# Patient Record
Sex: Male | Born: 1975
Health system: Southern US, Community
[De-identification: ages and names within clinical notes are randomized; demographics above are authoritative.]

## PROBLEM LIST (undated history)

## (undated) DIAGNOSIS — I10 Essential (primary) hypertension: Secondary | ICD-10-CM

## (undated) HISTORY — DX: Essential (primary) hypertension: I10

---

## 1997-11-26 ENCOUNTER — Emergency Department (HOSPITAL_COMMUNITY): Admission: EM | Admit: 1997-11-26 | Discharge: 1997-11-26 | Payer: Self-pay | Admitting: Emergency Medicine

## 2004-04-01 ENCOUNTER — Ambulatory Visit: Payer: Self-pay | Admitting: Family Medicine

## 2008-07-04 ENCOUNTER — Observation Stay (HOSPITAL_COMMUNITY): Admission: EM | Admit: 2008-07-04 | Discharge: 2008-07-06 | Payer: Self-pay | Admitting: Emergency Medicine

## 2008-07-04 ENCOUNTER — Ambulatory Visit: Payer: Self-pay | Admitting: Cardiology

## 2008-07-06 ENCOUNTER — Encounter (INDEPENDENT_AMBULATORY_CARE_PROVIDER_SITE_OTHER): Payer: Self-pay | Admitting: *Deleted

## 2008-08-06 ENCOUNTER — Emergency Department (HOSPITAL_COMMUNITY): Admission: EM | Admit: 2008-08-06 | Discharge: 2008-08-06 | Payer: Self-pay | Admitting: Emergency Medicine

## 2010-06-07 LAB — BASIC METABOLIC PANEL
CO2: 25 mEq/L (ref 19–32)
Calcium: 9.5 mg/dL (ref 8.4–10.5)
Chloride: 106 mEq/L (ref 96–112)
GFR calc Af Amer: >60 mL/min (ref >60)
Glucose, Bld: 94 mg/dL (ref 70–99)
Potassium: 4.1 mEq/L (ref 3.5–5.1)
Sodium: 139 mEq/L (ref 135–145)

## 2010-06-07 LAB — TROPONIN I
Troponin I: 0.01 ng/mL (ref 0.00–0.06)
Troponin I: 0.01 ng/mL (ref 0.00–0.06)

## 2010-06-07 LAB — CBC
HCT: 40.3 % (ref 39.0–52.0)
Hemoglobin: 13.6 g/dL (ref 13.0–17.0)
MCHC: 33.8 g/dL (ref 30.0–36.0)
MCV: 76.7 fL — ABNORMAL LOW (ref 78.0–100.0)
RBC: 5.26 MIL/uL (ref 4.22–5.81)
RDW: 14.3 % (ref 11.5–15.5)

## 2010-06-07 LAB — TSH: TSH: 0.558 u[IU]/mL (ref 0.350–4.500)

## 2010-06-07 LAB — CK TOTAL AND CKMB (NOT AT ARMC)
CK, MB: 1.4 ng/mL (ref 0.3–4.0)
CK, MB: 1.6 ng/mL (ref 0.3–4.0)
CK, MB: 2 ng/mL (ref 0.3–4.0)
Relative Index: 1.1 (ref 0.0–2.5)
Relative Index: 1.1 (ref 0.0–2.5)
Total CK: 197 U/L (ref 7–232)

## 2010-06-07 LAB — CARDIAC PANEL(CRET KIN+CKTOT+MB+TROPI)
CK, MB: 1.1 ng/mL (ref 0.3–4.0)
Total CK: 89 U/L (ref 7–232)
Troponin I: 0.01 ng/mL (ref 0.00–0.06)

## 2010-06-07 LAB — LIPID PANEL
LDL Cholesterol: 108 mg/dL — ABNORMAL HIGH (ref 0–99)
Total CHOL/HDL Ratio: 5.2 RATIO
Triglycerides: 87 mg/dL (ref <150)
VLDL: 17 mg/dL (ref 0–40)

## 2010-06-07 LAB — POCT CARDIAC MARKERS
CKMB, poc: 1.3 ng/mL (ref 1.0–8.0)
Troponin i, poc: <0.05 ng/mL (ref 0.00–0.09)

## 2010-07-12 NOTE — Consult Note (Signed)
NAMEERICE, AHLES NO.:  [id number]   MEDICAL RECORD NO.:  [id number]          PATIENT TYPE:  INP   LOCATION:  1823                         FACILITY:  [facility]   PHYSICIAN:  Luis Abed, MD, FACCDATE OF BIRTH:  03/23/1975   DATE OF CONSULTATION:  DATE OF DISCHARGE:                                 CONSULTATION   PRIMARY CARE PHYSICIAN:  None.   PRIMARY CARDIOLOGIST:  New will be Luis Abed, MD, [facility]   CHIEF COMPLAINT:  Chest pain.   HISTORY OF PRESENT ILLNESS:  Mr. Maselli is a 35 year old male with no  previous history of coronary artery disease.  He was wakened by chest  pain at 3 a.m. 4 days ago.  He states the pain was very severe; when it  first awakened him, it was a 10/10.  It waxed and waned, but never  resolved.  Today, he felt that it was getting a little worse and came to  the emergency room.  He has received three nitroglycerin with possible  help as well as aspirin.  Currently, his pain is improved, but not  completely resolved.  He has never had this kind of pain before.  The  pain is made worse by lying supine and he states he also feels that it  is worse whenever he uses the muscles of his chest as in reaching  forward or lifting something.  He does not feel that it is obviously  worse with deep inspiration.   PAST MEDICAL HISTORY:  He has not had a recent checkup and does not know  of any medical problems.  His cardiac risk factor is family history of  premature coronary artery disease.   PAST SURGICAL HISTORY:  None.   ALLERGIES:  No known drug allergies.   MEDICATIONS PRIOR TO ADMISSION:  None.   SOCIAL HISTORY:  He is single and lives in [place].  He is currently  employed.   FAMILY HISTORY:  His father is deceased and had an MI and a history of  bypass surgery.  There is no coronary artery disease known in any other  first-degree relatives.   REVIEW OF SYSTEMS:  He has not had fevers, chills, or sweats.  The chest  pain  as described above.  There are no known alleviating factors of the  chest pain and no associated symptoms except slight shortness of breath.  He has occasional arthralgias, but these are generally secondary to  overuse of muscle.  He does not have any depression or anxiety.  He does  not have reflux symptoms and no hematemesis, hemoptysis, or melena.  Full 14-point review of systems is otherwise negative.   PHYSICAL EXAMINATION:  VITAL SIGNS:  Temperature is 98.1, blood pressure  132/86, pulse 83, respiratory rate 20, and O2 saturation 100% on room  air.  GENERAL:  He is a well-developed [place] American male in no acute  distress.  HEENT:  Normal.  NECK:  There is no lymphadenopathy, thyromegaly, bruit, or JVD noted.  CV:  His heart is regular in rate and rhythm with an S1 and  S2.  No  significant murmur, rub, or gallop is noted.  LUNGS:  Essentially clear to auscultation bilaterally.  SKIN:  No rashes or lesions are noted.  ABDOMEN:  Soft and nontender with active bowel sounds.  EXTREMITIES:  There is no cyanosis, clubbing, or edema noted.  Distal  pulses are intact in all four extremities.  MUSCULOSKELETAL:  There is no joint deformity or effusions and no spine  or CVA tenderness.  There is some mild chest tenderness.  NEUROLOGIC:  He is alert and oriented with cranial nerves II-XII grossly  intact.   Chest x-ray:  No acute disease.   CT angiogram of the chest:  No evidence of pulmonary embolism, no acute  abnormality.  Multiple borderline axillary lymph nodes, which may be  hyperplastic, but a lymphoproliferative disorder cannot be excluded.   EKG:  Sinus rhythm, rate 85 with no acute ischemic changes.   Laboratory values are pending but point-of-care markers negative x1.   IMPRESSION:  Mr. Dowty was seen today by Dr. Myrtis Ser.  He has chest pain  with atypical features and no exertional component.  It is possibly  related to pericarditis.  We will try Toradol for pain control  and cycle  enzymes as ordered.  There is no rub and only mild chest wall tenderness  by exam.  He does have slight J-point elevation on his EKG.  Further  evaluation and treatment will depend on the results of the above testing  and intervention.      Theodore Demark, PA-C      Luis Abed, MD, [facility]  Electronically Signed    RB/MEDQ  D:  07/04/2008  T:  07/05/2008  Job:  [id number]

## 2010-07-12 NOTE — H&P (Signed)
Ian Reid, Ian Reid NO.:  [id number]   MEDICAL RECORD NO.:  [id number]          PATIENT TYPE:  INP   LOCATION:  3705                         FACILITY:  [facility]   PHYSICIAN:  Monte Fantasia, MD  DATE OF BIRTH:  05/24/1945   DATE OF ADMISSION:  07/04/2008  DATE OF DISCHARGE:                              HISTORY & PHYSICAL   PRIMARY CARE PHYSICIAN:  Unassigned.   CHIEF COMPLAINT:  Chest tightness since last 2-3 days.   HISTORY OF PRESENT ILLNESS:  A 35 year old African American gentleman  patient has come in with complaints of substernal chest pain which woke  him up at 3:00 a.m. in the morning associated with diaphoresis, which  occurred 2 days back.  The patient states that this chest pain has been  kind of persistent that will past 2-3 days, has an exertional component  to it.  The patient does complain of exertional shortness of breath on  climbing up 20 flights of stairs which is something new since last 2-3  days.  The patient denies at present of any nausea, vomiting, dizziness,  loss of consciousness, syncopal episode, or weakness.  The patient  denies any complaints of fever, cough, or productive phlegm.  The  patient denies of the pain being increasing or decreasing or associated  with his respirations.  Also, the patient denies of any abdominal pain.  No complaints of diarrhea.  No complaints of constipation.  No  complaints of any bowel or bladder complaints.   REVIEW OF SYSTEMS:  Review of 12-point systems have been negative other  than those mentioned in the HPI.   ALLERGIES:  The patient is allergic to ASPIRIN which he gets hives with  it.   PAST MEDICAL HISTORY:  None.   SOCIAL HISTORY:  The patient occasionally drinks over the weekend 1 or 2  beers.  No history of IV drug use.  No history of smoking.   FAMILY HISTORY:  Significant for brother having cardiac disease who is  age less than 34 and father having undergone a bypass at the age  of 62.   MEDICATIONS:  None.   PHYSICAL EXAMINATION:  VITAL SIGNS: On admission, temperature of 98.1,  blood pressure 139/81, heart rate of 82, respirations 20.  GENERAL:  The patient is comfortable in bed, in no apparent respiratory  distress.  HEENT:  Pupils equal, reacting to light.  No pallor.  No  lymphadenopathy.  NECK:  Supple.  No JVDs.  No carotid bruits.  RESPIRATORY:  Air entry is bilaterally equal.  No rales, no rhonchi.  CARDIOVASCULAR:  S1 and S2, regular rate and rhythm.  No murmurs heard.  Pain not reproducible on chest pressure.  ABDOMEN:  Soft.  No guarding, no rigidity, no tenderness.  EXTREMITIES:  No edema of feet.  Pedal pulses well felt.  SKIN:  Intact.  No evidence of any skin breakdown or rashes.  CNS:  The patient is alert, awake and oriented x3.  No focal  neurological deficits.   RADIOLOGICAL INVESTIGATIONS DONE AT THE TIME OF ADMISSION:  CT angio  done at on  Jul 04, 2008, impression, negative CT.  No evidence of any  pulmonary emboli or acute abnormality, multiple borderline axillary  lymph nodes, may be hyperplastic but lymphoproliferative disorder cannot  be excluded.   LABORATORIES DONE AT THE TIME OF ADMISSION:  Total WBCs 6.1, hemoglobin  13.6, hematocrit 40.3, platelets of 209, D-dimer 1.76.  Sodium 139,  potassium 4.1, chloride 106, bicarb 25, glucose 94, BUN 13, creatinine  0.92, calcium of 9.5.  Cardiac enzymes, one set has been negative.   ASSESSMENT AND PLAN:  Left-sided precordial chest pain, exertional in  nature to rule out any cardiac causes.   Plan is to admit the patient to a telemetry bed.  We will monitor the  patient for next 24 hours.  Have cardiac enzymes x3 sets done.  The  patient does complain of exertional chest pain given his age and family  history.  The patient will need stress test.  We would also get a 2-D  echo on him.  We would have a cardiology evaluation for the patient.  We  would also give him Plavix, as the  patient is allergic to aspirin.  DVT  and GI prophylaxis on Lovenox and Protonix.   Total time for admission of 45 minutes.      Monte Fantasia, MD  Electronically Signed     MP/MEDQ  D:  07/04/2008  T:  07/05/2008  Job:  [id number]

## 2010-07-12 NOTE — Discharge Summary (Signed)
Ian Reid, Ian Reid NO.:  [id number]   MEDICAL RECORD NO.:  [id number]          PATIENT TYPE:  INP   LOCATION:  2038                         FACILITY:  [facility]   PHYSICIAN:  Altha Harm, MDDATE OF BIRTH:  [birth year]   DATE OF ADMISSION:  07/04/2008  DATE OF DISCHARGE:  07/06/2008                               DISCHARGE SUMMARY   DISCHARGE DISPOSITION:  Home.   FINAL DISCHARGE DIAGNOSES:  1. Acute pericarditis.  2. Hypertension.  3. Nonspecific axillary lymphadenopathy.   CONSULTANTS:  Luis Abed, MD, [facility]   PROCEDURES:  None.   DIAGNOSTIC STUDIES:  1. Chest x-ray 1-view done on admission which shows no acute      cardiopulmonary process.  2. CT pulmonary angiogram done on admission which shows negative CT      angiography of the chest and no evidence of pulmonary embolus.      There are multiple borderline axillary lymph nodes.  3. A 2D echocardiogram done on Jul 06, 2008 which shows normal left      ventricular size and thickness.  Preserved systolic function with      an ejection fraction ranging from 55-60%.  No regional wall motion      abnormalities.  There is a calcified annulus of the mitral valve.   CODE STATUS:  Full code.   ALLERGIES:  ASPIRIN.   PRIMARY CARE PHYSICIAN:  Unassigned.   CHIEF COMPLAINT:  Chest pain.   HISTORY AND PHYSICAL:  Please refer to the H and P for details of the  HPI.   HOSPITAL COURSE:  1. The patient on admission was complaining of chest pain.  An EKG      revealed findings of slightly J point elevation which could be      consistent with pericarditis.  The patient continued to have  chest      discimfort and pain which actually appeared to be musculoskeletal      in nature.  He was started on colchicine and ibuprofen which      improved his pain.  A 2D echocardiogram was performed with findings      as noted above.  Recommendations per cardiology was that there was      no necessity for further  workup at this time.  Plans for treatment:      Colchicine for 4 weeks and ibuprofen for 4 weeks.  The patient does      not have a primary care physician and has been advised to follow up      with a primary care physician of his choosing as an outpatient.  2. Hypertension.  The patient was found to be stage I hypertension,      started on hydrochlorothiazide in the hospital.  He will need to      follow up with a primary care physician on the outside to evaluate      effective treatment and for further titration of medications as      necessary.  3. Axillary lymphadenopathy.  Surreptitious findings on the CT showed      some  borderline axillary lymphadenopathy.  The lymphadenopathy is      not palpable from the surface.  I advised the patient that he needs      to follow up with a primary care physician and have serial x-rays      likely within 1-3 months for further evaluation of the lymph nodes.      The patient did not show proliferation of lymph nodes on his      laboratory studies here in the hospital.  He had a normal white      blood cell count on admission with total white blood cell count      being 6.1.  The patient may actually benefit from having serial      WBCs done as an outpatient with a differential to evaluate for any      lymphocytosis.   Total time for discharge 22 minutes.      Altha Harm, MD  Electronically Signed     MAM/MEDQ  D:  07/06/2008  T:  07/06/2008  Job:  [id number]   cc:   Luis Abed, MD, [facility]

## 2012-07-04 ENCOUNTER — Other Ambulatory Visit: Payer: Self-pay | Admitting: Occupational Medicine

## 2012-07-04 ENCOUNTER — Ambulatory Visit: Payer: Self-pay

## 2012-07-04 DIAGNOSIS — Z021 Encounter for pre-employment examination: Secondary | ICD-10-CM

## 2012-10-03 ENCOUNTER — Other Ambulatory Visit: Payer: Self-pay | Admitting: Occupational Medicine

## 2012-10-03 ENCOUNTER — Ambulatory Visit: Payer: Self-pay

## 2012-10-03 DIAGNOSIS — Z0000 Encounter for general adult medical examination without abnormal findings: Secondary | ICD-10-CM

## 2013-01-07 ENCOUNTER — Ambulatory Visit (INDEPENDENT_AMBULATORY_CARE_PROVIDER_SITE_OTHER): Payer: BC Managed Care – PPO | Admitting: Family Medicine

## 2013-01-07 ENCOUNTER — Ambulatory Visit: Payer: BC Managed Care – PPO

## 2013-01-07 VITALS — BP 162/94 | HR 73 | Temp 98.3°F | Resp 18 | Ht 68.5 in | Wt 214.0 lb

## 2013-01-07 DIAGNOSIS — R0602 Shortness of breath: Secondary | ICD-10-CM

## 2013-01-07 DIAGNOSIS — R0789 Other chest pain: Secondary | ICD-10-CM

## 2013-01-07 DIAGNOSIS — R9431 Abnormal electrocardiogram [ECG] [EKG]: Secondary | ICD-10-CM

## 2013-01-07 DIAGNOSIS — J209 Acute bronchitis, unspecified: Secondary | ICD-10-CM

## 2013-01-07 DIAGNOSIS — I10 Essential (primary) hypertension: Secondary | ICD-10-CM

## 2013-01-07 MED ORDER — AZITHROMYCIN 250 MG PO TABS: ORAL_TABLET | ORAL | Status: DC

## 2013-01-07 MED ORDER — HYDROCHLOROTHIAZIDE 25 MG PO TABS: 25.0000 mg | ORAL_TABLET | Freq: Every day | ORAL | Status: DC

## 2013-01-07 MED ORDER — HYDROCODONE-HOMATROPINE 5-1.5 MG/5ML PO SYRP: 5.0000 mL | ORAL_SOLUTION | Freq: Three times a day (TID) | ORAL | Status: DC | PRN

## 2013-01-07 MED ORDER — IPRATROPIUM BROMIDE 0.03 % NA SOLN: 2.0000 | Freq: Four times a day (QID) | NASAL | Status: DC

## 2013-01-07 NOTE — Patient Instructions (Addendum)
Stay away from all otc cough/cold products with pseudoephedrine and phenylephrine in them - any decongestant product.  There is a line of products call "Coracidin" - they have a heart on the bottle - made for people with high blood pressure.  You are welcome to come back to see me this Sunday - anytime after 10 a.m. - we close at 6 p.m. Monday 8:30 - 2, or Wednesday 5 - 8 p.m.  Remember to be fasting at this visit so we can get all of your blood work.   If you don't want to wait in the walk-in clinic, you can schedule an appointment as well by calling [phone] - these appts take place in our building next door at [address]. I see patients there on Fridays.  DASH Diet The DASH diet stands for "Dietary Approaches to Stop Hypertension." It is a healthy eating plan that has been shown to reduce high blood pressure (hypertension) in as little as 14 days, while also possibly providing other significant health benefits. These other health benefits include reducing the risk of breast cancer after menopause and reducing the risk of type 2 diabetes, heart disease, colon cancer, and stroke. Health benefits also include weight loss and slowing kidney failure in patients with chronic kidney disease.  DIET GUIDELINES  Limit salt (sodium). Your diet should contain less than 1500 mg of sodium daily.  Limit refined or processed carbohydrates. Your diet should include mostly whole grains. Desserts and added sugars should be used sparingly.  Include small amounts of heart-healthy fats. These types of fats include nuts, oils, and tub margarine. Limit saturated and trans fats. These fats have been shown to be harmful in the body. CHOOSING FOODS  The following food groups are based on a 2000 calorie diet. See your Registered Dietitian for individual calorie needs. Grains and Grain Products (6 to 8 servings daily)  Eat More Often: Whole-wheat bread, brown rice, whole-grain or wheat pasta, quinoa, popcorn without added fat or  salt (air popped).  Eat Less Often: White bread, white pasta, white rice, cornbread. Vegetables (4 to 5 servings daily)  Eat More Often: Fresh, frozen, and canned vegetables. Vegetables may be raw, steamed, roasted, or grilled with a minimal amount of fat.  Eat Less Often/Avoid: Creamed or fried vegetables. Vegetables in a cheese sauce. Fruit (4 to 5 servings daily)  Eat More Often: All fresh, canned (in natural juice), or frozen fruits. Dried fruits without added sugar. One hundred percent fruit juice ( cup [237 mL] daily).  Eat Less Often: Dried fruits with added sugar. Canned fruit in light or heavy syrup. [employer], Fish, and Poultry (2 servings or less daily. One serving is 3 to 4 oz [85-114 g]).  Eat More Often: Ninety percent or leaner ground beef, tenderloin, sirloin. Round cuts of beef, chicken breast, [place] breast. All fish. Grill, bake, or broil your meat. Nothing should be fried.  Eat Less Often/Avoid: Fatty cuts of meat, [place], or chicken leg, thigh, or wing. Fried cuts of meat or fish. Dairy (2 to 3 servings)  Eat More Often: Low-fat or fat-free milk, low-fat plain or light yogurt, reduced-fat or part-skim cheese.  Eat Less Often/Avoid: Milk (whole, 2%).Whole milk yogurt. Full-fat cheeses. Nuts, Seeds, and Legumes (4 to 5 servings per week)  Eat More Often: All without added salt.  Eat Less Often/Avoid: Salted nuts and seeds, canned beans with added salt. Fats and Sweets (limited)  Eat More Often: Vegetable oils, tub margarines without trans fats, sugar-free gelatin. Mayonnaise  and salad dressings.  Eat Less Often/Avoid: Coconut oils, palm oils, butter, stick margarine, cream, half and half, cookies, candy, pie. FOR MORE INFORMATION The Dash Diet Eating Plan: www.dashdiet.org Document Released: 02/02/2011 Document Revised: 05/08/2011 Document Reviewed: 02/02/2011 [facility] Patient Information 2014 [place], [place]. Hypertension As your heart beats, it forces  blood through your arteries. This force is your blood pressure. If the pressure is too high, it is called hypertension (HTN) or high blood pressure. HTN is dangerous because you may have it and not know it. High blood pressure may mean that your heart has to work harder to pump blood. Your arteries may be narrow or stiff. The extra work puts you at risk for heart disease, stroke, and other problems.  Blood pressure consists of two numbers, a higher number over a lower, 110/72, for example. It is stated as "110 over 72." The ideal is below 120 for the top number (systolic) and under 80 for the bottom (diastolic). Write down your blood pressure today. You should pay close attention to your blood pressure if you have certain conditions such as:  Heart failure.  Prior heart attack.  Diabetes  Chronic kidney disease.  Prior stroke.  Multiple risk factors for heart disease. To see if you have HTN, your blood pressure should be measured while you are seated with your arm held at the level of the heart. It should be measured at least twice. A one-time elevated blood pressure reading (especially in the Emergency Department) does not mean that you need treatment. There may be conditions in which the blood pressure is different between your right and left arms. It is important to see your caregiver soon for a recheck. Most people have essential hypertension which means that there is not a specific cause. This type of high blood pressure may be lowered by changing lifestyle factors such as:  Stress.  Smoking.  Lack of exercise.  Excessive weight.  Drug/tobacco/alcohol use.  Eating less salt. Most people do not have symptoms from high blood pressure until it has caused damage to the body. Effective treatment can often prevent, delay or reduce that damage. TREATMENT  When a cause has been identified, treatment for high blood pressure is directed at the cause. There are a large number of medications  to treat HTN. These fall into several categories, and your caregiver will help you select the medicines that are best for you. Medications may have side effects. You should review side effects with your caregiver. If your blood pressure stays high after you have made lifestyle changes or started on medicines,   Your medication(s) may need to be changed.  Other problems may need to be addressed.  Be certain you understand your prescriptions, and know how and when to take your medicine.  Be sure to follow up with your caregiver within the time frame advised (usually within two weeks) to have your blood pressure rechecked and to review your medications.  If you are taking more than one medicine to lower your blood pressure, make sure you know how and at what times they should be taken. Taking two medicines at the same time can result in blood pressure that is too low. SEEK IMMEDIATE MEDICAL CARE IF:  You develop a severe headache, blurred or changing vision, or confusion.  You have unusual weakness or numbness, or a faint feeling.  You have severe chest or abdominal pain, vomiting, or breathing problems. MAKE SURE YOU:   Understand these instructions.  Will watch your condition.  Will get help right away if you are not doing well or get worse. Document Released: 02/13/2005 Document Revised: 05/08/2011 Document Reviewed: 10/04/2007 [facility] Patient Information 2014 [place], [place].

## 2013-01-07 NOTE — Progress Notes (Signed)
This chart was scribed for Norberto Sorenson, MD at [facility] by Yevette Edwards, Scribe. This pt's care was started at 3:20 PM.  Subjective:    Patient ID: Ian Reid, male    DOB: 05-24-1975, 37 y.o.   MRN: [id number]  Chief Complaint  Patient presents with  . cough, congestion, fever, not feeling well since sunday  . Hypertension    HPI  Ian Reid is a 37 y.o. male, with a h/o HTN, who presents to [facility] complaining of a persistent cough productive of yellow phlegm which began two days ago. Now accompanied by nasal congestion, a subjective fever, and chills. He reports that his sleep has been disturbed due to the cough. Additional symptoms he has experienced are  "burning" to his chest, worse on the left side, SOB, and constant chest tightness esp w/ deep breathing.  The pt denies experiencing sinus pressure, sore throat, chest pain, or wheezing.   The pt has utilized Nyquil and Dayquil to mitigate the symptoms with minimal relief.   The pt has taken blood pressure medication - tried 2 different types in the past, seeing the occupational health dr at his job, but he stopped taking the medication due to erectile dysfunction.  He reports that he occasionally checks his blood pressure at home as his brother has acuff, and his blood pressure is generally in the 160/100 range. States his brother gets on him about getting to the dr and getting his BP down as his brother has had to have a kidney transplant due to DM and HTN.  The pt is a non-smoker.   He does not have a PCP.   No current outpatient prescriptions on file prior to visit.   No current facility-administered medications on file prior to visit.   Past Medical History  Diagnosis Date  . Hypertension     Allergies  Allergen Reactions  . Asa [Aspirin]    Review of Systems  Constitutional: Positive for fever, chills, diaphoresis, activity change, appetite change and fatigue. Negative for unexpected weight change.   HENT: Positive for congestion, postnasal drip and rhinorrhea. Negative for ear pain, facial swelling, sinus pressure, sore throat and trouble swallowing.   Respiratory: Positive for cough, chest tightness and shortness of breath. Negative for wheezing.   Cardiovascular: Negative for chest pain, palpitations and leg swelling.  Hematological: Negative for adenopathy.  Psychiatric/Behavioral: Positive for sleep disturbance.    Triage Vitals: BP 162/94  Pulse 73  Temp(Src) 98.3 F (36.8 C) (Oral)  Resp 18  Ht 5' 8.5" (1.74 m)  Wt 214 lb (97.07 kg)  BMI 32.06 kg/m2  SpO2 100%    Objective:   Physical Exam  Nursing note and vitals reviewed. Constitutional: He is oriented to person, place, and time. He appears well-developed and well-nourished. No distress.  HENT:  Head: Normocephalic and atraumatic.  Right Ear: Tympanic membrane is erythematous and retracted.  Left Ear: Tympanic membrane is erythematous and retracted.  Nose: Mucosal edema and rhinorrhea present.  Mouth/Throat: Uvula is midline. Posterior oropharyngeal edema present.  Eyes: EOM are normal.  Neck: Neck supple. No tracheal deviation present. No thyromegaly present.  Cardiovascular: Normal rate, regular rhythm and normal heart sounds.   No murmur heard. Pulmonary/Chest: Effort normal and breath sounds normal. No respiratory distress. He has no wheezes.  Musculoskeletal: Normal range of motion.  Lymphadenopathy:       Head (right side): No submental, no submandibular, no tonsillar, no preauricular and no posterior auricular adenopathy present.  Head (left side): No submental, no submandibular, no tonsillar, no preauricular and no posterior auricular adenopathy present.    He has no cervical adenopathy.  Neurological: He is alert and oriented to person, place, and time.  Skin: Skin is warm and dry.  Psychiatric: He has a normal mood and affect. His behavior is normal.   [facility] reading (PRIMARY) by  Dr. Clelia Croft. chest  x-ray normal.    EXAM: CHEST 2 VIEW  COMPARISON: 10/03/2012  FINDINGS: The heart size and mediastinal contours are within normal limits. Both lungs are clear. The visualized skeletal structures are unremarkable.  IMPRESSION: No active cardiopulmonary disease.    Pt's predicted Reid flow is 633, but his measured flow today is 450.   EKG reading: Atrial enlargement. Flipped T-waves in lead 1, v1, v5, v6.  Lead 3 with R, R'. 1 mm of ST elevation in V1, V2, V3 - Suspect early repol varietn.    Assessment & Plan:  Chest tightness - Plan: DG Chest 2 View, EKG 12-Lead  Shortness of breath - Plan: DG Chest 2 View, EKG 12-Lead  Essential hypertension, benign - not tolerant of 2 prior bp meds due to ED side effect. Start hctz 25 and recheck in 1 wk fasting - pt will bring prior bp meds w/ him so we can see what he failed.  Discussed what otc cough/cold medicines to avoid - stop dayquil/nyquil, decongestants, etc. Reviewed low salt DASH diet.  Nonspecific abnormal electrocardiogram (ECG) (EKG) - EKG concerning for cardiac strain - discussed w/ pt importance of BP control and assessing other risk factors at f/u such as fasting lipids. Plan: Ambulatory referral to Cardiology - i think that all of pt's current sxs are clearly tied to his acute illness but warned to RTC or call 911 if CP continues, changes, or worsens.  Acute bronchitis  Meds ordered this encounter  Medications  . azithromycin (ZITHROMAX) 250 MG tablet    Sig: Take 2 tabs PO x 1 dose, then 1 tab PO QD x 4 days    Dispense:  6 tablet    Refill:  0  . HYDROcodone-homatropine (HYCODAN) 5-1.5 MG/5ML syrup    Sig: Take 5 mLs by mouth every 8 (eight) hours as needed for cough.    Dispense:  120 mL    Refill:  0  . ipratropium (ATROVENT) 0.03 % nasal spray    Sig: Place 2 sprays into the nose 4 (four) times daily.    Dispense:  30 mL    Refill:  1  . hydrochlorothiazide (HYDRODIURIL) 25 MG tablet    Sig: Take 1 tablet (25 mg  total) by mouth daily.    Dispense:  30 tablet    Refill:  1    I personally performed the services described in this documentation, which was scribed in my presence. The recorded information has been reviewed and considered, and addended by me as needed.  Norberto Sorenson, MD MPH

## 2013-03-06 ENCOUNTER — Ambulatory Visit (INDEPENDENT_AMBULATORY_CARE_PROVIDER_SITE_OTHER): Payer: BC Managed Care – PPO | Admitting: Internal Medicine

## 2013-03-06 ENCOUNTER — Encounter: Payer: Self-pay | Admitting: Internal Medicine

## 2013-03-06 VITALS — BP 152/110 | HR 97 | Ht 68.0 in | Wt 218.7 lb

## 2013-03-06 DIAGNOSIS — R9431 Abnormal electrocardiogram [ECG] [EKG]: Secondary | ICD-10-CM

## 2013-03-06 DIAGNOSIS — I10 Essential (primary) hypertension: Secondary | ICD-10-CM

## 2013-03-06 DIAGNOSIS — R0602 Shortness of breath: Secondary | ICD-10-CM

## 2013-03-06 MED ORDER — VALSARTAN-HYDROCHLOROTHIAZIDE 80-12.5 MG PO TABS: 1.0000 | ORAL_TABLET | Freq: Every day | ORAL | Status: DC

## 2013-03-06 NOTE — Progress Notes (Signed)
OFFICE NOTE  Chief Complaint:  Atypical CP, abnormal EKG, HTN  Primary Care Physician: No PCP Per Patient  HPI:  Ian Reid is a pleasant 38 year old male who is referred to me for evaluation of an abnormal EKG. He's recently seen in the office after developing an upper respiratory infection. He was treated for this and had some chest discomfort. An EKG was performed which showed some T-wave inversions concerning for ischemia. He was therefore referred for evaluation of this EKG. He also has a history of hypertension, which she reports that he is known for some time, but has ignored. Recently he was started on HCTZ which he reports has improved some swelling that he had in his ankles and his hands, because his custom-made bowling ball fits his fingers better.  Unfortunately, he feels that this medication may have "zapped his sex drive".  He does note an improvement in his blood pressure however it is not yet at goal. He is not currently doing a lot of exercise as he is busy working, but he denies any chest pain with exertion or worsening shortness of breath doing normal physical activities.  PMHx:  Past Medical History  Diagnosis Date  . Hypertension     History reviewed. No pertinent past surgical history.  FAMHx:  Family History  Problem Relation Age of Onset  . Diabetes Father   . Heart disease Father   . Hypertension Father   . Hyperlipidemia Father   . Diabetes Brother   . Hyperlipidemia Brother   . Hypertension Brother     SOCHx:   reports that he has never smoked. He has never used smokeless tobacco. He reports that he drinks about 1.5 ounces of alcohol per week. He reports that he does not use illicit drugs.  ALLERGIES:  Allergies  Allergen Reactions  . Asa [Aspirin] Shortness Of Breath    ROS: A comprehensive review of systems was negative except for: Respiratory: positive for dyspnea on exertion Cardiovascular: positive for chest pain  HOME MEDS: Current  Outpatient Prescriptions  Medication Sig Dispense Refill  . ipratropium (ATROVENT) 0.03 % nasal spray Place 2 sprays into the nose 4 (four) times daily.  30 mL  1  . valsartan-hydrochlorothiazide (DIOVAN-HCT) 80-12.5 MG per tablet Take 1 tablet by mouth daily.  30 tablet  6   No current facility-administered medications for this visit.    LABS/IMAGING: No results found for this or any previous visit (from the past 48 hour(s)). No results found.  VITALS: BP 152/110  Pulse 97  Ht 5\' 8"  (1.727 m)  Wt 218 lb 11.2 oz (99.202 kg)  BMI 33.26 kg/m2  EXAM: General appearance: alert and no distress Neck: no carotid bruit and no JVD Lungs: clear to auscultation bilaterally Heart: regular rate and rhythm, S1, S2 normal, no murmur, click, rub or gallop Abdomen: soft, non-tender; bowel sounds normal; no masses,  no organomegaly Extremities: extremities normal, atraumatic, no cyanosis or edema Pulses: 2+ and symmetric Skin: Skin color, texture, turgor normal. No rashes or lesions Neurologic: Grossly normal Psych: mood, affect normal  EKG: Normal sinus rhythm at 97, nonspecific T wave changes  ASSESSMENT: 1. Abnormal EKG with nonspecific T-wave changes 2. Atypical chest pain 3. Mild dyspnea 4. Hypertension-uncontrolled  PLAN: 1.   Mr. Isabell has some mild EKG changes which are nonspecific. He has had some atypical sounding chest pain, which was worse when he had a respiratory infection. I think it would be reasonable for him to exercise on a  Bruce treadmill stress test to rule out any significant ischemia. His blood pressure has been poorly controlled, which could have contributed to some of his EKG changes as well. He also feels that HCTZ may be "zapping his sex drive". I tried to reassure him that I doubt this is the case, however he may benefit from a reduction in the dose. I recommended starting him on Diovan HCTZ 80/12.5 mg daily. I'll see him back in a few weeks to discuss results of  his stress test and to recheck his blood pressure.  Thank you as always for the kind referral.  Chrystie NoseKenneth C. Hilty, MD, [facility] Attending Cardiologist [facility]  HILTY,Kenneth C 03/06/2013, 4:58 PM

## 2013-03-06 NOTE — Patient Instructions (Signed)
STOP hydrochlorothiazide.   START valsartan-hctz 80-12.5mg  once daily  Dr. Rennis GoldenHilty has ordered an exercise tolerance test - stress test. This is done here in our office.   Your physician recommends that you schedule a follow-up appointment in: 2-3 weeks - after your test.

## 2013-03-07 ENCOUNTER — Encounter: Payer: Self-pay | Admitting: Internal Medicine

## 2013-03-07 ENCOUNTER — Other Ambulatory Visit (HOSPITAL_COMMUNITY): Payer: Self-pay | Admitting: *Deleted

## 2013-03-10 ENCOUNTER — Encounter: Payer: Self-pay | Admitting: Internal Medicine

## 2013-03-13 ENCOUNTER — Ambulatory Visit (HOSPITAL_COMMUNITY)
Admission: RE | Admit: 2013-03-13 | Discharge: 2013-03-13 | Disposition: A | Discharge status: Home / Self Care | Payer: BC Managed Care – PPO | Source: Ambulatory Visit | Attending: Internal Medicine | Admitting: Internal Medicine

## 2013-03-13 DIAGNOSIS — R0602 Shortness of breath: Secondary | ICD-10-CM

## 2013-03-13 DIAGNOSIS — R9431 Abnormal electrocardiogram [ECG] [EKG]: Secondary | ICD-10-CM | POA: Insufficient documentation

## 2013-03-14 ENCOUNTER — Telehealth: Payer: Self-pay | Admitting: *Deleted

## 2013-03-14 NOTE — Telephone Encounter (Signed)
Called patient with normal ETT results. Reminded of OV on 1/23

## 2013-03-21 ENCOUNTER — Encounter: Payer: Self-pay | Admitting: Internal Medicine

## 2013-03-21 ENCOUNTER — Ambulatory Visit (INDEPENDENT_AMBULATORY_CARE_PROVIDER_SITE_OTHER): Payer: BC Managed Care – PPO | Admitting: Internal Medicine

## 2013-03-21 VITALS — BP 150/80 | HR 84 | Ht 68.5 in | Wt 224.0 lb

## 2013-03-21 DIAGNOSIS — I10 Essential (primary) hypertension: Secondary | ICD-10-CM

## 2013-03-21 DIAGNOSIS — R9431 Abnormal electrocardiogram [ECG] [EKG]: Secondary | ICD-10-CM

## 2013-03-21 DIAGNOSIS — N529 Male erectile dysfunction, unspecified: Secondary | ICD-10-CM

## 2013-03-21 MED ORDER — VALSARTAN-HYDROCHLOROTHIAZIDE 160-12.5 MG PO TABS: 1.0000 | ORAL_TABLET | Freq: Every day | ORAL | Status: DC

## 2013-03-21 NOTE — Patient Instructions (Addendum)
Your physician has recommended you make the following change in your medication: TAKE valsartan-hctz 160-12.5mg  daily.   You have been referred to [facility] at [facility]  Your physician wants you to follow-up in: 6 months. You will receive a reminder letter in the mail two months in advance. If you don't receive a letter, please call our office to schedule the follow-up appointment.

## 2013-03-23 ENCOUNTER — Encounter: Payer: Self-pay | Admitting: Internal Medicine

## 2013-03-23 DIAGNOSIS — N529 Male erectile dysfunction, unspecified: Secondary | ICD-10-CM | POA: Insufficient documentation

## 2013-03-23 NOTE — Progress Notes (Signed)
OFFICE NOTE  Chief Complaint:  Atypical CP, abnormal EKG, HTN  Primary Care Physician: No PCP Per Patient  HPI:  Ian Reid is a pleasant 38 year old male who is referred to me for evaluation of an abnormal EKG. He's recently seen in the office after developing an upper respiratory infection. He was treated for this and had some chest discomfort. An EKG was performed which showed some T-wave inversions concerning for ischemia. He was therefore referred for evaluation of this EKG. He also has a history of hypertension, which she reports that he is known for some time, but has ignored. Recently he was started on HCTZ which he reports has improved some swelling that he had in his ankles and his hands, because his custom-made bowling ball fits his fingers better.  Unfortunately, he feels that this medication may have "zapped his sex drive".  He does note an improvement in his blood pressure however it is not yet at goal. He is not currently doing a lot of exercise as he is busy working, but he denies any chest pain with exertion or worsening shortness of breath doing normal physical activities.  Ian Reid returns today for followup of his stress test. He underwent a Bruce treadmill stress test on 03/13/2013, which was negative for ischemia. He had pretty good exercise tolerance. He reports his chest discomfort has improved. His blood pressure is also improved but still not at goal today. He continues to complain of erectile dysfunction and lower sexual interest.  PMHx:  Past Medical History  Diagnosis Date  . Hypertension     History reviewed. No pertinent past surgical history.  FAMHx:  Family History  Problem Relation Age of Onset  . Diabetes Father   . Heart disease Father   . Hypertension Father   . Hyperlipidemia Father   . Diabetes Brother   . Hyperlipidemia Brother   . Hypertension Brother     SOCHx:   reports that he has never smoked. He has never used smokeless  tobacco. He reports that he drinks about 1.5 ounces of alcohol per week. He reports that he does not use illicit drugs.  ALLERGIES:  Allergies  Allergen Reactions  . Asa [Aspirin] Shortness Of Breath    ROS: A comprehensive review of systems was negative except for: Respiratory: positive for dyspnea on exertion Cardiovascular: positive for chest pain  HOME MEDS: Current Outpatient Prescriptions  Medication Sig Dispense Refill  . valsartan-hydrochlorothiazide (DIOVAN-HCT) 160-12.5 MG per tablet Take 1 tablet by mouth daily.  30 tablet  11   No current facility-administered medications for this visit.    LABS/IMAGING: No results found for this or any previous visit (from the past 48 hour(s)). No results found.  VITALS: BP 150/80  Pulse 84  Ht 5' 8.5" (1.74 m)  Wt 224 lb (101.606 kg)  BMI 33.56 kg/m2  EXAM: deferred  EKG: deferred  ASSESSMENT: 1. Abnormal EKG with nonspecific T-wave changes - low risk Bruce treadmill stress test 2. Atypical chest pain 3. Mild dyspnea 4. Hypertension-uncontrolled 5. Erectile dysfunction  PLAN: 1.   Ian Reid had a negative stress test which is reassuring. His blood pressure is still a lot of: All increase his Diovan HCTZ to 160/12.5 mg daily. He continues to complain of low sexual interest and erectile dysfunction, therefore refer him to [facility] for further evaluation.  Followup of blood pressure in 6 months.  Thank you as always for the kind referral.  Chrystie NoseKenneth C. Acie Custis, MD, [facility] IncFACC Attending  Cardiologist [facility]  Maguire Sime C 03/23/2013, 12:56 PM

## 2014-09-25 ENCOUNTER — Ambulatory Visit
Admission: RE | Admit: 2014-09-25 | Discharge: 2014-09-25 | Disposition: A | Discharge status: Home / Self Care | Payer: No Typology Code available for payment source | Source: Ambulatory Visit | Attending: Occupational Medicine | Admitting: Occupational Medicine

## 2014-09-25 ENCOUNTER — Other Ambulatory Visit: Payer: Self-pay | Admitting: Occupational Medicine

## 2014-09-25 DIAGNOSIS — Z0000 Encounter for general adult medical examination without abnormal findings: Secondary | ICD-10-CM

## 2016-08-31 ENCOUNTER — Emergency Department (HOSPITAL_COMMUNITY)
Admission: EM | Admit: 2016-08-31 | Discharge: 2016-08-31 | Disposition: A | Discharge status: Home / Self Care | Payer: BLUE CROSS/BLUE SHIELD | Attending: Emergency Medicine | Admitting: Emergency Medicine

## 2016-08-31 ENCOUNTER — Encounter (HOSPITAL_COMMUNITY): Payer: Self-pay

## 2016-08-31 ENCOUNTER — Emergency Department (HOSPITAL_COMMUNITY): Payer: BLUE CROSS/BLUE SHIELD

## 2016-08-31 DIAGNOSIS — R509 Fever, unspecified: Secondary | ICD-10-CM | POA: Diagnosis not present

## 2016-08-31 DIAGNOSIS — J189 Pneumonia, unspecified organism: Secondary | ICD-10-CM | POA: Diagnosis not present

## 2016-08-31 DIAGNOSIS — R079 Chest pain, unspecified: Secondary | ICD-10-CM | POA: Diagnosis present

## 2016-08-31 DIAGNOSIS — I10 Essential (primary) hypertension: Secondary | ICD-10-CM | POA: Insufficient documentation

## 2016-08-31 DIAGNOSIS — J181 Lobar pneumonia, unspecified organism: Secondary | ICD-10-CM

## 2016-08-31 LAB — COMPREHENSIVE METABOLIC PANEL
ALBUMIN: 3.9 g/dL (ref 3.5–5.0)
ALT: 48 U/L (ref 17–63)
ANION GAP: 8 (ref 5–15)
AST: 47 U/L — ABNORMAL HIGH (ref 15–41)
Alkaline Phosphatase: 94 U/L (ref 38–126)
BUN: 10 mg/dL (ref 6–20)
CO2: 25 mmol/L (ref 22–32)
Calcium: 9.5 mg/dL (ref 8.9–10.3)
Chloride: 99 mmol/L — ABNORMAL LOW (ref 101–111)
Creatinine, Ser: 1.32 mg/dL — ABNORMAL HIGH (ref 0.61–1.24)
GFR calc Af Amer: >60 mL/min (ref >60)
Glucose, Bld: 141 mg/dL — ABNORMAL HIGH (ref 65–99)
POTASSIUM: 4.8 mmol/L (ref 3.5–5.1)
Sodium: 132 mmol/L — ABNORMAL LOW (ref 135–145)
Total Bilirubin: 0.9 mg/dL (ref 0.3–1.2)
Total Protein: 7.9 g/dL (ref 6.5–8.1)

## 2016-08-31 LAB — URINALYSIS, ROUTINE W REFLEX MICROSCOPIC
Bilirubin Urine: NEGATIVE
GLUCOSE, UA: NEGATIVE mg/dL
Hgb urine dipstick: NEGATIVE
Ketones, ur: NEGATIVE mg/dL
LEUKOCYTES UA: NEGATIVE
Nitrite: NEGATIVE
PH: 6 (ref 5.0–8.0)
PROTEIN: NEGATIVE mg/dL
Specific Gravity, Urine: 1.013 (ref 1.005–1.030)

## 2016-08-31 LAB — I-STAT TROPONIN, ED: Troponin i, poc: 0.01 ng/mL (ref 0.00–0.08)

## 2016-08-31 LAB — CBC WITH DIFFERENTIAL/PLATELET
Basophils Absolute: 0 10*3/uL (ref 0.0–0.1)
Basophils Relative: 0 %
Eosinophils Absolute: 0 10*3/uL (ref 0.0–0.7)
Eosinophils Relative: 0 %
HEMATOCRIT: 41.4 % (ref 39.0–52.0)
Hemoglobin: 13.8 g/dL (ref 13.0–17.0)
LYMPHS PCT: 9 %
Lymphs Abs: 0.8 10*3/uL (ref 0.7–4.0)
MCH: 27 pg (ref 26.0–34.0)
MCHC: 33.3 g/dL (ref 30.0–36.0)
MCV: 81 fL (ref 78.0–100.0)
Monocytes Absolute: 0.8 10*3/uL (ref 0.1–1.0)
Monocytes Relative: 9 %
NEUTROS ABS: 6.9 10*3/uL (ref 1.7–7.7)
Neutrophils Relative %: 82 %
Platelets: 153 10*3/uL (ref 150–400)
RBC: 5.11 MIL/uL (ref 4.22–5.81)
RDW: 13 % (ref 11.5–15.5)
WBC: 8.6 10*3/uL (ref 4.0–10.5)

## 2016-08-31 LAB — I-STAT CG4 LACTIC ACID, ED: Lactic Acid, Venous: 1.63 mmol/L (ref 0.5–1.9)

## 2016-08-31 MED ORDER — LISINOPRIL 20 MG PO TABS
20.0000 mg | ORAL_TABLET | Freq: Once | ORAL | Status: AC
  Administered 2016-08-31: 20 mg via ORAL
  Filled 2016-08-31: qty 1

## 2016-08-31 MED ORDER — LISINOPRIL 20 MG PO TABS: 20.0000 mg | ORAL_TABLET | Freq: Every day | ORAL | 0 refills | Status: DC

## 2016-08-31 MED ORDER — LEVOFLOXACIN IN D5W 500 MG/100ML IV SOLN
500.0000 mg | Freq: Once | INTRAVENOUS | Status: AC
  Administered 2016-08-31: 500 mg via INTRAVENOUS
  Filled 2016-08-31: qty 100

## 2016-08-31 MED ORDER — SODIUM CHLORIDE 0.9 % IV BOLUS (SEPSIS)
1000.0000 mL | Freq: Once | INTRAVENOUS | Status: AC
  Administered 2016-08-31: 1000 mL via INTRAVENOUS

## 2016-08-31 MED ORDER — KETOROLAC TROMETHAMINE 30 MG/ML IJ SOLN
30.0000 mg | Freq: Once | INTRAMUSCULAR | Status: AC
  Administered 2016-08-31: 30 mg via INTRAVENOUS
  Filled 2016-08-31: qty 1

## 2016-08-31 MED ORDER — ACETAMINOPHEN 325 MG PO TABS
650.0000 mg | ORAL_TABLET | Freq: Once | ORAL | Status: AC | PRN
  Administered 2016-08-31: 650 mg via ORAL

## 2016-08-31 MED ORDER — TRAMADOL HCL 50 MG PO TABS: 50.0000 mg | ORAL_TABLET | Freq: Four times a day (QID) | ORAL | 0 refills | Status: DC | PRN

## 2016-08-31 MED ORDER — ACETAMINOPHEN 325 MG PO TABS
ORAL_TABLET | ORAL | Status: AC
  Filled 2016-08-31: qty 2

## 2016-08-31 MED ORDER — LEVOFLOXACIN 500 MG PO TABS: 500.0000 mg | ORAL_TABLET | Freq: Every day | ORAL | 0 refills | Status: DC

## 2016-08-31 NOTE — Discharge Instructions (Signed)
Follow up with a family md in 1-2 weeks.  Follow up with the cardiologist in 1-2 weeks.  Return if needed

## 2016-08-31 NOTE — ED Triage Notes (Signed)
Pt reports middle to left side of his chest that started Monday but worse today, especially with taking deep breaths. Pt sent from Urgent Care. He also reports subjective fevers, chills, diarrhea and headache.

## 2016-08-31 NOTE — ED Provider Notes (Signed)
[facility]-EMERGENCY DEPT Provider Note   CSN: [id number] Arrival date & time: 08/31/16  1301     History   Chief Complaint Chief Complaint  Patient presents with  . Chest Pain    HPI Breckan Cafiero is a 41 y.o. male.  Patient complains of some chest discomfort and fever   The history is provided by the patient.  Chest Pain   This is a new problem. The current episode started more than 2 days ago. The problem occurs daily. The problem has not changed since onset.The pain is associated with movement. The pain is present in the lateral region. The pain is at a severity of 4/10. The pain is moderate. Associated symptoms include a fever. Pertinent negatives include no abdominal pain, no back pain, no cough and no headaches.  Pertinent negatives for past medical history include no seizures.    Past Medical History:  Diagnosis Date  . Hypertension     Patient Active Problem List   Diagnosis Date Noted  . Erectile dysfunction 03/23/2013  . HTN (hypertension) 03/06/2013  . Abnormal EKG 03/06/2013    History reviewed. No pertinent surgical history.     Home Medications    Prior to Admission medications   Medication Sig Start Date End Date Taking? Authorizing Provider  ibuprofen (ADVIL,MOTRIN) 200 MG tablet Take 600 mg by mouth every 6 (six) hours as needed for mild pain.   Yes [provider]  levofloxacin (LEVAQUIN) 500 MG tablet Take 1 tablet (500 mg total) by mouth daily. 08/31/16   Bethann Berkshire, MD  lisinopril (PRINIVIL,ZESTRIL) 20 MG tablet Take 1 tablet (20 mg total) by mouth daily. 08/31/16   Bethann Berkshire, MD  traMADol (ULTRAM) 50 MG tablet Take 1 tablet (50 mg total) by mouth every 6 (six) hours as needed. 08/31/16   Bethann Berkshire, MD  valsartan-hydrochlorothiazide (DIOVAN-HCT) 160-12.5 MG per tablet Take 1 tablet by mouth daily. Patient not taking: Reported on 08/31/2016 03/21/13   Chrystie Nose, MD    Family History Family History  Problem Relation Age of  Onset  . Diabetes Father   . Heart disease Father   . Hypertension Father   . Hyperlipidemia Father   . Diabetes Brother   . Hyperlipidemia Brother   . Hypertension Brother     Social History Social History  Substance Use Topics  . Smoking status: Never Smoker  . Smokeless tobacco: Never Used  . Alcohol use 1.5 oz/week    3 drink(s) per week     Allergies   Asa [aspirin]   Review of Systems Review of Systems  Constitutional: Positive for fatigue and fever. Negative for appetite change.  HENT: Negative for congestion, ear discharge and sinus pressure.   Eyes: Negative for discharge.  Respiratory: Negative for cough.   Cardiovascular: Positive for chest pain.  Gastrointestinal: Negative for abdominal pain and diarrhea.  Genitourinary: Negative for frequency and hematuria.  Musculoskeletal: Negative for back pain.  Skin: Negative for rash.  Neurological: Negative for seizures and headaches.  Psychiatric/Behavioral: Negative for hallucinations.     Physical Exam Updated Vital Signs BP (!) 169/91   Pulse 96   Temp (!) 102.4 F (39.1 C) (Oral)   Resp (!) 25   SpO2 98%   Physical Exam  Constitutional: He is oriented to person, place, and time. He appears well-developed.  HENT:  Head: Normocephalic.  Eyes: Conjunctivae and EOM are normal. No scleral icterus.  Neck: Neck supple. No thyromegaly present.  Cardiovascular: Normal rate and regular rhythm.  Exam reveals no gallop and no friction rub.   No murmur heard. Pulmonary/Chest: No stridor. He has no wheezes. He has no rales. He exhibits no tenderness.  Abdominal: He exhibits no distension. There is no tenderness. There is no rebound.  Musculoskeletal: Normal range of motion. He exhibits no edema.  Lymphadenopathy:    He has no cervical adenopathy.  Neurological: He is oriented to person, place, and time. He exhibits normal muscle tone. Coordination normal.  Skin: No rash noted. No erythema.  Psychiatric: He  has a normal mood and affect. His behavior is normal.     ED Treatments / Results  Labs (all labs ordered are listed, but only abnormal results are displayed) Labs Reviewed  COMPREHENSIVE METABOLIC PANEL - Abnormal; Notable for the following:       Result Value   Sodium 132 (*)    Chloride 99 (*)    Glucose, Bld 141 (*)    Creatinine, Ser 1.32 (*)    AST 47 (*)    All other components within normal limits  CULTURE, BLOOD (ROUTINE X 2)  CULTURE, BLOOD (ROUTINE X 2)  CBC WITH DIFFERENTIAL/PLATELET  URINALYSIS, ROUTINE W REFLEX MICROSCOPIC  I-STAT TROPOININ, ED  I-STAT CG4 LACTIC ACID, ED    EKG  EKG Interpretation  Date/Time:  Thursday August 31 2016 13:06:44 EDT Ventricular Rate:  113 PR Interval:  146 QRS Duration: 74 QT Interval:  296 QTC Calculation: 406 R Axis:   41 Text Interpretation:  Sinus tachycardia ST & T wave abnormality, consider inferolateral ischemia Abnormal ECG Confirmed by Bethann Berkshire [phone]) on 08/31/2016 3:05:38 PM       Radiology Dg Chest 2 View  Result Date: 08/31/2016 CLINICAL DATA:  Heat exhaustion 3 days ago, now with chest pain, nausea and diarrhea EXAM: CHEST  2 VIEW COMPARISON:  Chest x-ray of 09/25/2014 FINDINGS: On the frontal view there is abnormal opacity overlying the heart shadow obscuring the left cardiophrenic margins. This appears to have air bronchograms and probably represents a focus pneumonia not visualized on the lateral view. Followup chest x-ray is recommended after interval treatment to ensure clearing and exclude underlying malignancy. The right lung is clear. No pleural effusion is seen. Mediastinal and hilar contours are unremarkable. The heart is within normal limits in size. No bony abnormality is seen. IMPRESSION: Abnormal opacity medially at the left lung base most likely represents pneumonia. Recommend followup chest x-ray to ensure clearing and exclude underlying malignancy. Electronically Signed   By: Dwyane Dee M.D.   On:  08/31/2016 14:06    Procedures Procedures (including critical care time)  Medications Ordered in ED Medications  acetaminophen (TYLENOL) 325 MG tablet (not administered)  acetaminophen (TYLENOL) tablet 650 mg (650 mg Oral Given 08/31/16 1317)  sodium chloride 0.9 % bolus 1,000 mL (1,000 mLs Intravenous New Bag/Given 08/31/16 1537)  levofloxacin (LEVAQUIN) IVPB 500 mg (500 mg Intravenous New Bag/Given 08/31/16 1549)  lisinopril (PRINIVIL,ZESTRIL) tablet 20 mg (20 mg Oral Given 08/31/16 1537)  ketorolac (TORADOL) 30 MG/ML injection 30 mg (30 mg Intravenous Given 08/31/16 1549)     Initial Impression / Assessment and Plan / ED Course  I have reviewed the triage vital signs and the nursing notes.  Pertinent labs & imaging results that were available during my care of the patient were reviewed by me and considered in my medical decision making (see chart for details).     Patient with pneumonia and hypertension that is not treated. Patient does not look toxic. He will  be placed on Levaquin Ultram and lisinopril and is referred to family physician and cardiology  Final Clinical Impressions(s) / ED Diagnoses   Final diagnoses:  Community acquired pneumonia of left lower lobe of lung (HCC)  Essential hypertension    New Prescriptions New Prescriptions   LEVOFLOXACIN (LEVAQUIN) 500 MG TABLET    Take 1 tablet (500 mg total) by mouth daily.   LISINOPRIL (PRINIVIL,ZESTRIL) 20 MG TABLET    Take 1 tablet (20 mg total) by mouth daily.   TRAMADOL (ULTRAM) 50 MG TABLET    Take 1 tablet (50 mg total) by mouth every 6 (six) hours as needed.     Bethann BerkshireZammit, Randie Bloodgood, MD 08/31/16 [phone]

## 2016-09-05 LAB — CULTURE, BLOOD (ROUTINE X 2)
CULTURE: NO GROWTH
Culture: NO GROWTH
SPECIAL REQUESTS: ADEQUATE
Special Requests: ADEQUATE

## 2017-06-29 ENCOUNTER — Emergency Department (HOSPITAL_COMMUNITY)
Admission: EM | Admit: 2017-06-29 | Discharge: 2017-06-29 | Disposition: A | Discharge status: Home / Self Care | Payer: No Typology Code available for payment source | Attending: Emergency Medicine | Admitting: Emergency Medicine

## 2017-06-29 ENCOUNTER — Encounter (HOSPITAL_COMMUNITY): Payer: Self-pay | Admitting: Emergency Medicine

## 2017-06-29 DIAGNOSIS — Z79899 Other long term (current) drug therapy: Secondary | ICD-10-CM | POA: Insufficient documentation

## 2017-06-29 DIAGNOSIS — I160 Hypertensive urgency: Secondary | ICD-10-CM

## 2017-06-29 MED ORDER — VALSARTAN-HYDROCHLOROTHIAZIDE 160-12.5 MG PO TABS: 1.0000 | ORAL_TABLET | Freq: Every day | ORAL | 0 refills | Status: DC

## 2017-06-29 MED ORDER — LISINOPRIL 20 MG PO TABS: 20.0000 mg | ORAL_TABLET | Freq: Every day | ORAL | 0 refills | Status: DC

## 2017-06-29 NOTE — ED Triage Notes (Signed)
Pt states today has been stressful. He was at [employer] for a job visit for blood work and drug screen and sent here for HTN. He states he has been out of his meds due to being out of work. He also was missing his aunts funeral today for this job apt. He also is having car trouble today. No chest pain. No headaches.

## 2017-06-29 NOTE — ED Provider Notes (Signed)
[facility] EMERGENCY DEPARTMENT Provider Note   CSN: [id number] Arrival date & time: 06/29/17  1208     History   Chief Complaint Chief Complaint  Patient presents with  . Hypertension    HPI Ian Reid is a 42 y.o. male.  HPI   42 year old male with known history of hypertension currently not on any blood pressure medication presenting requesting for medication refill.  Patient endorsed that he is been going through a lot of stress, with being off work, missing his aunts funeral today due to having a job appointment, and also having call trouble.  He knows that his blood pressure is elevated.  He denies any associated chest pain, headache, shortness of breath or any other condition.  He does not have medication due to not having a job.  He mentioned he has not been on blood pressure medication for at least a year.  During his job interview today and when he was receiving blood work, he was notified that his blood pressure is too high and needs to be addressed.  He otherwise feels fine.  He does admits to exercising regularly and is starting to monitor his diet a bit more.  He admits to consuming moderate amount of salty food.  He endorsed strong family history of high blood pressure.  Past Medical History:  Diagnosis Date  . Hypertension     Patient Active Problem List   Diagnosis Date Noted  . Erectile dysfunction 03/23/2013  . HTN (hypertension) 03/06/2013  . Abnormal EKG 03/06/2013    No past surgical history on file.      Home Medications    Prior to Admission medications   Medication Sig Start Date End Date Taking? Authorizing Provider  ibuprofen (ADVIL,MOTRIN) 200 MG tablet Take 600 mg by mouth every 6 (six) hours as needed for mild pain.    [provider]  levofloxacin (LEVAQUIN) 500 MG tablet Take 1 tablet (500 mg total) by mouth daily. 08/31/16   Bethann Berkshire, MD  lisinopril (PRINIVIL,ZESTRIL) 20 MG tablet Take 1 tablet (20 mg  total) by mouth daily. 08/31/16   Bethann Berkshire, MD  traMADol (ULTRAM) 50 MG tablet Take 1 tablet (50 mg total) by mouth every 6 (six) hours as needed. 08/31/16   Bethann Berkshire, MD  valsartan-hydrochlorothiazide (DIOVAN-HCT) 160-12.5 MG per tablet Take 1 tablet by mouth daily. Patient not taking: Reported on 08/31/2016 03/21/13   Chrystie Nose, MD    Family History Family History  Problem Relation Age of Onset  . Diabetes Father   . Heart disease Father   . Hypertension Father   . Hyperlipidemia Father   . Diabetes Brother   . Hyperlipidemia Brother   . Hypertension Brother     Social History Social History   Tobacco Use  . Smoking status: Never Smoker  . Smokeless tobacco: Never Used  Substance Use Topics  . Alcohol use: Yes    Alcohol/week: 1.5 oz    Types: 3 drink(s) per week  . Drug use: No     Allergies   Asa [aspirin]   Review of Systems Review of Systems  All other systems reviewed and are negative.    Physical Exam Updated Vital Signs BP (!) 189/130 (BP Location: Right Arm)   Pulse 87   Temp 98.1 F (36.7 C) (Oral)   Resp 16   SpO2 99%   Physical Exam  Constitutional: He appears well-developed and well-nourished. No distress.  HENT:  Head: Atraumatic.  Eyes: Conjunctivae  are normal.  Neck: Neck supple. No JVD present.  Cardiovascular: Normal rate and regular rhythm.  Pulmonary/Chest: Effort normal and breath sounds normal.  Abdominal: He exhibits no distension. There is no tenderness.  Neurological: He is alert.  Skin: No rash noted.  Psychiatric: He has a normal mood and affect.  Nursing note and vitals reviewed.    ED Treatments / Results  Labs (all labs ordered are listed, but only abnormal results are displayed) Labs Reviewed - No data to display  EKG None  Radiology No results found.  Procedures Procedures (including critical care time)  Medications Ordered in ED Medications - No data to display   Initial Impression /  Assessment and Plan / ED Course  I have reviewed the triage vital signs and the nursing notes.  Pertinent labs & imaging results that were available during my care of the patient were reviewed by me and considered in my medical decision making (see chart for details).     BP (!) 183/117   Pulse 83   Temp 98.1 F (36.7 C) (Oral)   Resp 16   SpO2 99%    Final Clinical Impressions(s) / ED Diagnoses   Final diagnoses:  Hypertensive urgency    ED Discharge Orders        Ordered    lisinopril (PRINIVIL,ZESTRIL) 20 MG tablet  Daily     06/29/17 1357    valsartan-hydrochlorothiazide (DIOVAN-HCT) 160-12.5 MG tablet  Daily     06/29/17 1357     1:53 PM Patient presents requesting for refill of his blood pressure medication.  He has been out of his blood pressure medication for the past year and currently his blood pressure is documented as 189/130.  No symptoms concerning for hypertensive emergency.  Moderate amount of time was spent discussing about lifestyle changes including incorporating the DASH diet as well as taking his blood pressure medication.  Patient will need to follow-up with PCP for recheck.  Return precautions discussed.   Fayrene Helper, PA-C 06/29/17 1357    Linwood Dibbles, MD 07/02/17 0900

## 2017-07-17 MED FILL — VALSARTAN-HCTZ 160-12.5 MG: 160-12.5 | 30 days supply | Qty: 30 | Fill #0

## 2017-09-25 ENCOUNTER — Ambulatory Visit
Admission: EM | Admit: 2017-09-25 | Discharge: 2017-09-25 | Disposition: A | Discharge status: Home / Self Care | Payer: No Typology Code available for payment source | Attending: Family Medicine | Admitting: Family Medicine

## 2017-09-25 ENCOUNTER — Encounter: Payer: Self-pay | Admitting: Emergency Medicine

## 2017-09-25 ENCOUNTER — Other Ambulatory Visit: Payer: Self-pay

## 2017-09-25 DIAGNOSIS — I10 Essential (primary) hypertension: Secondary | ICD-10-CM

## 2017-09-25 LAB — BASIC METABOLIC PANEL
ANION GAP: 10 (ref 5–15)
BUN: 19 mg/dL (ref 6–20)
CO2: 25 mmol/L (ref 22–32)
Calcium: 9.2 mg/dL (ref 8.9–10.3)
Chloride: 101 mmol/L (ref 98–111)
Creatinine, Ser: 1.22 mg/dL (ref 0.61–1.24)
GFR calc Af Amer: >60 mL/min (ref >60)
GLUCOSE: 114 mg/dL — AB (ref 70–99)
POTASSIUM: 4.3 mmol/L (ref 3.5–5.1)
Sodium: 136 mmol/L (ref 135–145)

## 2017-09-25 MED ORDER — LISINOPRIL 20 MG PO TABS: 20.0000 mg | ORAL_TABLET | Freq: Every day | ORAL | 0 refills | Status: DC

## 2017-09-25 MED ORDER — CLONIDINE HCL 0.2 MG PO TABS
0.2000 mg | ORAL_TABLET | Freq: Once | ORAL | Status: AC
  Administered 2017-09-25: 0.2 mg via ORAL

## 2017-09-25 NOTE — Discharge Instructions (Signed)
Follow up with Primary Care provider 

## 2017-09-25 NOTE — ED Triage Notes (Signed)
Patient states that he has been out of his blood pressure medicine for the past couple of weeks.  Patient reports his blood pressure has been elevated.

## 2017-09-25 NOTE — ED Provider Notes (Signed)
[facility]    CSN: [id number] Arrival date & time: 09/25/17  1140     History   Chief Complaint Chief Complaint  Patient presents with  . Hypertension    HPI Ian Reid is a 42 y.o. male.   42 yo male with a c/o hypertension and out of his blood pressure medicine. States he has taken lisinopril in the past as well as valsartan/hctz. Has not seen a primary provider in over 1 year. Denies any chest pain or shortness of breath.   The history is provided by the patient.  Hypertension     Past Medical History:  Diagnosis Date  . Hypertension     Patient Active Problem List   Diagnosis Date Noted  . Erectile dysfunction 03/23/2013  . HTN (hypertension) 03/06/2013  . Abnormal EKG 03/06/2013    History reviewed. No pertinent surgical history.     Home Medications    Prior to Admission medications   Medication Sig Start Date End Date Taking? Authorizing Provider  ibuprofen (ADVIL,MOTRIN) 200 MG tablet Take 600 mg by mouth every 6 (six) hours as needed for mild pain.    [provider]  levofloxacin (LEVAQUIN) 500 MG tablet Take 1 tablet (500 mg total) by mouth daily. 08/31/16   Bethann Berkshire, MD  lisinopril (PRINIVIL,ZESTRIL) 20 MG tablet Take 1 tablet (20 mg total) by mouth daily. 09/25/17   Payton Mccallum, MD  traMADol (ULTRAM) 50 MG tablet Take 1 tablet (50 mg total) by mouth every 6 (six) hours as needed. 08/31/16   Bethann Berkshire, MD  valsartan-hydrochlorothiazide (DIOVAN-HCT) 160-12.5 MG tablet Take 1 tablet by mouth daily. 06/29/17   Fayrene Helper, PA-C    Family History Family History  Problem Relation Age of Onset  . Diabetes Father   . Heart disease Father   . Hypertension Father   . Hyperlipidemia Father   . Diabetes Brother   . Hyperlipidemia Brother   . Hypertension Brother     Social History Social History   Tobacco Use  . Smoking status: Never Smoker  . Smokeless tobacco: Never Used  Substance Use Topics  . Alcohol use:  Yes    Alcohol/week: 1.8 oz    Types: 3 drink(s) per week  . Drug use: No     Allergies   Asa [aspirin]   Review of Systems Review of Systems   Physical Exam Triage Vital Signs ED Triage Vitals  Enc Vitals Group     BP 09/25/17 1158 (!) 195/127     Pulse Rate 09/25/17 1158 84     Resp 09/25/17 1158 16     Temp 09/25/17 1158 98.2 F (36.8 C)     Temp Source 09/25/17 1158 Oral     SpO2 09/25/17 1158 100 %     Weight 09/25/17 1155 205 lb (93 kg)     Height 09/25/17 1155 5\' 8"  (1.727 m)     Head Circumference --      Peak Flow --      Pain Score 09/25/17 1155 0     Pain Loc --      Pain Edu? --      Excl. in GC? --    No data found.  Updated Vital Signs BP (!) 153/102 (BP Location: Right Arm)   Pulse 84   Temp 98.2 F (36.8 C) (Oral)   Resp 16   Ht 5\' 8"  (1.727 m)   Wt 205 lb (93 kg)   SpO2 100%   BMI 31.17  kg/m   Visual Acuity Right Eye Distance:   Left Eye Distance:   Bilateral Distance:    Right Eye Near:   Left Eye Near:    Bilateral Near:     Physical Exam  Constitutional: He appears well-developed and well-nourished. No distress.  Cardiovascular: Normal rate, regular rhythm and normal heart sounds.  Pulmonary/Chest: Effort normal and breath sounds normal. No stridor. No respiratory distress. He has no wheezes. He has no rales.  Musculoskeletal: He exhibits no edema.  Skin: He is not diaphoretic.  Nursing note and vitals reviewed.    UC Treatments / Results  Labs (all labs ordered are listed, but only abnormal results are displayed) Labs Reviewed  BASIC METABOLIC PANEL - Abnormal; Notable for the following components:      Result Value   Glucose, Bld 114 (*)    All other components within normal limits    EKG None  Radiology No results found.  Procedures Procedures (including critical care time)  Medications Ordered in UC Medications  cloNIDine (CATAPRES) tablet 0.2 mg (0.2 mg Oral Given 09/25/17 1203)    Initial Impression /  Assessment and Plan / UC Course  I have reviewed the triage vital signs and the nursing notes.  Pertinent labs & imaging results that were available during my care of the patient were reviewed by me and considered in my medical decision making (see chart for details).      Final Clinical Impressions(s) / UC Diagnoses   Final diagnoses:  Essential hypertension     Discharge Instructions     Follow up with Primary Care provider    ED Prescriptions    Medication Sig Dispense Auth. Provider   lisinopril (PRINIVIL,ZESTRIL) 20 MG tablet Take 1 tablet (20 mg total) by mouth daily. 30 tablet Payton Mccallumonty, Sascha Baugher, MD     1. Lab results and diagnosis reviewed with patient 2. rx as per orders above; reviewed possible side effects, interactions, risks and benefits  3. Recommend supportive treatment with dietary modifications 4. Establish care and follow up with a PCP for ongoing chronic management 5. Follow-up prn if symptoms worsen or don't improve   Controlled Substance Prescriptions [place] Controlled Substance Registry consulted? Not Applicable   Payton Mccallumonty, Carron Jaggi, MD 09/25/17 [phone]

## 2018-09-24 ENCOUNTER — Other Ambulatory Visit: Payer: Self-pay

## 2018-09-24 ENCOUNTER — Ambulatory Visit
Admission: EM | Admit: 2018-09-24 | Discharge: 2018-09-24 | Disposition: A | Discharge status: Home / Self Care | Payer: Self-pay | Attending: Urgent Care | Admitting: Urgent Care

## 2018-09-24 DIAGNOSIS — I10 Essential (primary) hypertension: Secondary | ICD-10-CM

## 2018-09-24 DIAGNOSIS — R0609 Other forms of dyspnea: Secondary | ICD-10-CM

## 2018-09-24 DIAGNOSIS — Z7189 Other specified counseling: Secondary | ICD-10-CM

## 2018-09-24 DIAGNOSIS — R439 Unspecified disturbances of smell and taste: Secondary | ICD-10-CM

## 2018-09-24 MED ORDER — LISINOPRIL 20 MG PO TABS: 20.0000 mg | ORAL_TABLET | Freq: Every day | ORAL | 0 refills | Status: AC

## 2018-09-24 NOTE — ED Triage Notes (Signed)
Patient complains of loss of taste and sense of smell and shortness of breath with slight cough that started yesterday.

## 2018-09-24 NOTE — ED Provider Notes (Signed)
[place]   Name: Ian Reid DOB: 01/29/1976 MRN: [id number] CSN: [id number] PCP: Patient, No Pcp Per  Arrival date and time:  09/24/18 [id number]  Chief Complaint:  Shortness of Breath   NOTE: Prior to seeing the patient today, I have reviewed the triage nursing documentation and vital signs. Clinical staff has updated patient's PMH/PSHx, current medication list, and drug allergies/intolerances to ensure comprehensive history available to assist in medical decision making.   History:   HPI: Ian Reid is a 43 y.o. male who presents today with complaints of congestion, shortness of breath, and altered sense of smell. Patient notes that he woke up this more congestion and a slight dry cough. When walking into work, he appreciated the fact that he was experiencing more exertional shortness of breath as compared to his baseline. He denies any shortness of breath at the time he is being seen in clinic today. Patient ate a donut and drank some coffee here at work this morning and reports that he could not taste either, which caused him to be concerned about SARS-CoV-2 (novel coronavirus). Patient denies any associated fevers, sore throat, or ear pain. He has had a very minor cough in the mornings for a couple of weeks.   Incidentally, patient noted to be hypertensive at 178/116 today. He has a known diagnosis of essential hypertension for which he has been prescribed lisinopril and Diovan. Patient has not taken the Diovan in quite sometime citing that this medication was "on the list to stop taking because it caused cancer". He has continued his prescribed lisinopril, however ran out of the medication x 2-3 weeks ago. Patient currently has no primary care provider due to lack of insurance. Patient states, "I am waiting for my [facility] kick in [facility]".  Patient denies chest pain and palpitations. He has not experienced any headaches, visual changes, or vertiginous symptoms. He does endorse the  fact that he has been using some over the counter decongestants to help with his symptoms.    Past Medical History:  Diagnosis Date  . Hypertension     History reviewed. No pertinent surgical history.  Family History  Problem Relation Age of Onset  . Diabetes Father   . Heart disease Father   . Hypertension Father   . Hyperlipidemia Father   . Diabetes Brother   . Hyperlipidemia Brother   . Hypertension Brother     Social History   Tobacco Use  . Smoking status: Never Smoker  . Smokeless tobacco: Never Used  Substance Use Topics  . Alcohol use: Yes    Alcohol/week: 3.0 standard drinks    Types: 3 drink(s) per week  . Drug use: No    Patient Active Problem List   Diagnosis Date Noted  . Erectile dysfunction 03/23/2013  . HTN (hypertension) 03/06/2013  . Abnormal EKG 03/06/2013    Home Medications:    Current Meds  Medication Sig  .  lisinopril (PRINIVIL,ZESTRIL) 20 MG tablet Take 1 tablet (20 mg total) by mouth daily.    Allergies:   Asa [aspirin]  Review of Systems (ROS): Review of Systems  Constitutional: Negative for activity change, appetite change, chills, fatigue and fever.  HENT: Positive for congestion and postnasal drip. Negative for ear pain, rhinorrhea, sinus pressure, sinus pain, sneezing, sore throat and trouble swallowing.        Dysosmia  Respiratory: Positive for cough (slight (dry) in the am) and shortness of breath (exertional).   Cardiovascular: Negative for chest pain, palpitations  and leg swelling.  Gastrointestinal: Negative for abdominal pain, diarrhea, nausea and vomiting.  Musculoskeletal: Negative for arthralgias, back pain, gait problem, myalgias and neck pain.  Skin: Negative for color change, pallor and rash.  Neurological: Negative for dizziness, syncope, weakness, numbness and headaches.  Hematological: Negative for adenopathy.  All other systems reviewed and are negative.    Vital Signs: Today's Vitals   09/24/18 0845  09/24/18 0850  BP:  (!) 178/116  Pulse:  78  Resp:  18  Temp:  98.6 F (37 C)  TempSrc:  Oral  SpO2:  100%  Weight: 215 lb (97.5 kg)   Height: 5\' 8"  (1.727 m)   PainSc: 0-No pain     Physical Exam: Physical Exam  Constitutional: He is oriented to person, place, and time and well-developed, well-nourished, and in no distress.  HENT:  Head: Normocephalic and atraumatic.  Nose: Mucosal edema present. No rhinorrhea or sinus tenderness.  Mouth/Throat: Uvula is midline and mucous membranes are normal. Posterior oropharyngeal erythema present. No oropharyngeal exudate or posterior oropharyngeal edema.  Neck: Normal range of motion. Neck supple. No tracheal deviation present.  Cardiovascular: Normal rate, regular rhythm, normal heart sounds and intact distal pulses. Exam reveals no gallop and no friction rub.  No murmur heard. Pulmonary/Chest: Effort normal and breath sounds normal. No respiratory distress. He has no wheezes. He has no rales.  Abdominal: Soft. Bowel sounds are normal. He exhibits no distension.  Lymphadenopathy:    He has no cervical adenopathy.  Neurological: He is alert and oriented to person, place, and time. Gait normal. GCS score is 15.  Skin: Skin is warm and dry. No rash noted.  Psychiatric: Mood, memory, affect and judgment normal.  Nursing note and vitals reviewed.   Urgent Care Treatments / Results:   LABS: PLEASE NOTE: all labs that were ordered this encounter are listed, however only abnormal results are displayed. Labs Reviewed  NOVEL CORONAVIRUS, NAA (HOSPITAL ORDER, SEND-OUT TO REF LAB)    EKG: -None  RADIOLOGY: No results found.  PROCEDURES: Procedures  MEDICATIONS RECEIVED THIS VISIT: Medications - No data to display  PERTINENT CLINICAL COURSE NOTES/UPDATES:   Initial Impression / Assessment and Plan / Urgent Care Course:  Pertinent labs & imaging results that were available during my care of the patient were personally reviewed by me  and considered in my medical decision making (see lab/imaging section of note for values and interpretations).  Ian Reid is a 43 y.o. male who [facility] with complaints of Shortness of Breath   Patient overall well appearing and in no acute distress today in clinic. Presenting symptoms (see HPI) and exam as documented above. He presents with concerns related to possible SARS-CoV-2 (novel coronavirus) infection. Discussed typical symptom constellation. Patient with congestion, cough, and newly developed dysosmia. Given potential for occupational exposure, testing is reasonable. Patient collected SARS-CoV-2 via facility approved self swab process today under the supervision of certified clinical staff. Discussed variable turn around times associated with testing, as swabs are being processed at [facility], and have been taking as long as 7 days. He was advised to self quarantine, per [facility] DH[facility] guidelines, until negative results received.    Regarding his blood pressure, we discussed the need for proper management to prevent negative sequela down the road. This will include regular visits with an established PCP. Patient's insurance has not gone into effect yet. Patient verbalizes plans to [facility] once insurance allows him to do so.  I encouraged patient to call human resources today to discuss, as his health is important. Patient not wanting to restart the Diovan that he was previously prescribed. Will restart lisinopril at his previous dose to allow for permissive HTN and not lower his reading too quickly. Discussed that this dose will need to be titrated,  and that he will likely require additional medications based on what he is telling me about his past. He verbalized understanding. 30 day prescription for lisinopril 20 mg tablets provided today. Patient encouraged to monitor his blood pressures at home/work.   Current clinical condition warrants patient  being out of work in order to recover from his current injury/illness. He was provided with the appropriate documentation to provide to his place of employment that will allow for him to RTW on 09/16/2018 pending receipt of negative SARS-CoV-2 test results.   I have reviewed the follow up and strict return precautions for any new or worsening symptoms. Patient is aware of symptoms that would be deemed urgent/emergent, and would thus require further evaluation either here or in the emergency department. At the time of discharge, he verbalized understanding and consent with the discharge plan as it was reviewed with him. All questions were fielded by provider and/or clinic staff prior to patient discharge.    Final Clinical Impressions / Urgent Care Diagnoses:   Final diagnoses:  DOE (dyspnea on exertion)  Advice Given About Covid-19 Virus Infection  Dysosmia  Essential hypertension    New Prescriptions:  [place] Controlled Substance Registry consulted? Not Applicable  Meds ordered this encounter  Medications  . lisinopril (ZESTRIL) 20 MG tablet    Sig: Take 1 tablet (20 mg total) by mouth daily.    Dispense:  30 tablet    Refill:  0    Recommended Follow up Care:  Patient encouraged to follow up with the following provider within the specified time frame, or sooner as dictated by the severity of his symptoms. As always, he was instructed that for any urgent/emergent care needs, he should seek care either here or in the emergency department for more immediate evaluation.  Follow-up Information    Call  Juline Patch, MD.   Specialty: Family Medicine Why: You need to establish primary care services. Call for an appointment. Contact information: [address] [place] [address] [phone]         NOTE: This note was prepared using Dragon dictation software along with smaller phrase technology. Despite my best ability to proofread, there is the potential that  transcriptional errors may still occur from this process, and are completely unintentional.     Karen Kitchens, NP 09/24/18 [phone]

## 2018-09-24 NOTE — Discharge Instructions (Signed)
It was very nice seeing you today in clinic. Thank you for entrusting me with your care.   Your blood pressure is very high. We will restart your medication today. Please start ASAP. This could be contributing to your shortness of breath. If you develop any worsening shortness of breath or chest pain, please go directly to the ER.  AVOID the use of any over the counter decongestants. They can cause your blood pressure to elevate.   Self quarantine at home until NEGATIVE test results received. They have been averaging  3-5 days.   Make arrangements to establish care with a regular doctor. I have provided you the name and office contact information for an excellent local provider. If your symptoms/condition worsens, please seek follow up care either here or in the ER. Please remember, our [place] providers are "right here with you" when you need [place].   Again, it was my pleasure to take care of you today. Thank you for choosing our clinic. I hope that you start to feel better quickly.   Honor Loh, MSN, APRN, FNP-C, CEN Advanced Practice Provider [facility]

## 2018-09-26 ENCOUNTER — Telehealth (HOSPITAL_COMMUNITY): Payer: Self-pay | Admitting: Emergency Medicine

## 2018-09-26 LAB — NOVEL CORONAVIRUS, NAA (HOSP ORDER, SEND-OUT TO REF LAB; TAT 18-24 HRS): SARS-CoV-2, NAA: DETECTED — AB

## 2018-09-26 NOTE — Telephone Encounter (Signed)
Your test for COVID-19 was positive, meaning that you were infected with the novel coronavirus and could give the germ to others.  Please continue isolation at home, for at least 10 days since the start of your fever/cough/breathlessness and until you have had 3 consecutive days without fever (without taking a fever reducer) and with cough/breathlessness improving. Please continue good preventive care measures, including:  frequent hand-washing, avoid touching your face, cover coughs/sneezes, stay out of crowds and keep a 6 foot distance from others.  Recheck or go to the nearest hospital ED tent for re-assessment if fever/cough/breathlessness return.  Patient contacted and made aware of    results, all questions answered Work notes sent through [employer].

## 2019-09-23 ENCOUNTER — Telehealth: Payer: Self-pay

## 2019-09-23 NOTE — Telephone Encounter (Signed)
Cancelled lisinopril d/t allergic reaction- called in hctz

## 2019-09-23 NOTE — Telephone Encounter (Signed)
Called in lisinopril 20mg - will see in 30 days

## 2019-10-03 ENCOUNTER — Ambulatory Visit: Payer: Self-pay | Admitting: Family Medicine

## 2019-11-05 ENCOUNTER — Ambulatory Visit: Payer: Self-pay | Admitting: Family Medicine

## 2020-10-18 ENCOUNTER — Telehealth: Payer: Self-pay

## 2020-10-18 NOTE — Telephone Encounter (Signed)
Mr. Ian Reid called our office this morning requested a new pt appointment. Pt stated he is [facility] the [facility] for  and his BP is high at 194/126. He was told he needs to be on BP medication ASAP. Mr. Ian Reid was notified that we are unable to get him a new pt appointment today or even later this week. He was advised to contact his current PCP (he stated he does not have one.), he was then notified to go to the nearest ED or UC.

## 2020-10-29 DIAGNOSIS — Z131 Encounter for screening for diabetes mellitus: Secondary | ICD-10-CM | POA: Diagnosis not present

## 2020-10-29 DIAGNOSIS — E669 Obesity, unspecified: Secondary | ICD-10-CM | POA: Diagnosis not present

## 2020-10-29 DIAGNOSIS — M6208 Separation of muscle (nontraumatic), other site: Secondary | ICD-10-CM | POA: Diagnosis not present

## 2020-10-29 DIAGNOSIS — I10 Essential (primary) hypertension: Secondary | ICD-10-CM | POA: Diagnosis not present

## 2020-11-12 DIAGNOSIS — Z6832 Body mass index (BMI) 32.0-32.9, adult: Secondary | ICD-10-CM | POA: Diagnosis not present

## 2020-11-12 DIAGNOSIS — I10 Essential (primary) hypertension: Secondary | ICD-10-CM | POA: Diagnosis not present

## 2020-11-17 DIAGNOSIS — I10 Essential (primary) hypertension: Secondary | ICD-10-CM | POA: Diagnosis not present

## 2021-01-24 ENCOUNTER — Other Ambulatory Visit: Payer: Self-pay

## 2021-01-24 ENCOUNTER — Encounter (HOSPITAL_COMMUNITY): Payer: Self-pay

## 2021-01-24 ENCOUNTER — Emergency Department (HOSPITAL_COMMUNITY)
Admission: EM | Admit: 2021-01-24 | Discharge: 2021-01-24 | Discharge status: Against Medical Advice | Payer: BC Managed Care – PPO | Attending: Emergency Medicine | Admitting: Emergency Medicine

## 2021-01-24 ENCOUNTER — Emergency Department (HOSPITAL_COMMUNITY): Payer: BC Managed Care – PPO

## 2021-01-24 DIAGNOSIS — Z5321 Procedure and treatment not carried out due to patient leaving prior to being seen by health care provider: Secondary | ICD-10-CM | POA: Diagnosis not present

## 2021-01-24 DIAGNOSIS — R0789 Other chest pain: Secondary | ICD-10-CM | POA: Diagnosis not present

## 2021-01-24 DIAGNOSIS — R079 Chest pain, unspecified: Secondary | ICD-10-CM | POA: Insufficient documentation

## 2021-01-24 LAB — BASIC METABOLIC PANEL
Anion gap: 10 (ref 5–15)
BUN: 30 mg/dL — ABNORMAL HIGH (ref 6–20)
CO2: 25 mmol/L (ref 22–32)
Calcium: 9.4 mg/dL (ref 8.9–10.3)
Chloride: 100 mmol/L (ref 98–111)
Creatinine, Ser: 2.43 mg/dL — ABNORMAL HIGH (ref 0.61–1.24)
GFR, Estimated: 32 mL/min — ABNORMAL LOW (ref >60)
Glucose, Bld: 127 mg/dL — ABNORMAL HIGH (ref 70–99)
Potassium: 4.5 mmol/L (ref 3.5–5.1)
Sodium: 135 mmol/L (ref 135–145)

## 2021-01-24 LAB — CBC
HCT: 44.3 % (ref 39.0–52.0)
Hemoglobin: 14.4 g/dL (ref 13.0–17.0)
MCH: 27.5 pg (ref 26.0–34.0)
MCHC: 32.5 g/dL (ref 30.0–36.0)
MCV: 84.7 fL (ref 80.0–100.0)
Platelets: 216 10*3/uL (ref 150–400)
RBC: 5.23 MIL/uL (ref 4.22–5.81)
RDW: 13.6 % (ref 11.5–15.5)
WBC: 6.2 10*3/uL (ref 4.0–10.5)
nRBC: 0 % (ref 0.0–0.2)

## 2021-01-24 LAB — TROPONIN I (HIGH SENSITIVITY)
Troponin I (High Sensitivity): 10 ng/L (ref <18)
Troponin I (High Sensitivity): 9 ng/L (ref <18)

## 2021-01-24 NOTE — ED Provider Notes (Signed)
Emergency Medicine Provider Triage Evaluation Note  Ian Reid , a 45 y.o. male  was evaluated in triage.  Pt complains of chest pain.  Started at 3 AM last night, its intermittent.  Feels substernal, initially was on the left pectoral muscle.  There is associated nausea and shortness of breath, no vomiting.  Takes medicine for hypertension, no previous coronary events.  Not diabetic.  Family history of coronary disease, no fatalities before the age of 17.  Review of Systems  Positive: Chest pain, nausea Negative: Negative  Physical Exam  BP 111/77 (BP Location: Right Arm)   Pulse 79   Temp 98.1 F (36.7 C) (Oral)   Resp 18   SpO2 100%  Gen:   Awake, no distress   Resp:  Normal effort  MSK:   Moves extremities without difficulty  Other:  S1-S2  Medical Decision Making  Medically screening exam initiated at 1:06 PM.  Appropriate orders placed.  Keyvon Herter was informed that the remainder of the evaluation will be completed by another provider, this initial triage assessment does not replace that evaluation, and the importance of remaining in the ED until their evaluation is complete.  Chest pain work-up   Theron Arista, PA-C 01/24/21 1308    Ernie Avena, MD 01/24/21 [id number]

## 2021-01-24 NOTE — ED Triage Notes (Signed)
Patient complains of intermittent chest pain since last night, no radiation. Pain unchanged with movement. nonsmoker

## 2021-01-24 NOTE — ED Notes (Signed)
X2 for room assignment with no response 

## 2021-06-27 DIAGNOSIS — E669 Obesity, unspecified: Secondary | ICD-10-CM | POA: Diagnosis not present

## 2021-06-27 DIAGNOSIS — Z6831 Body mass index (BMI) 31.0-31.9, adult: Secondary | ICD-10-CM | POA: Diagnosis not present

## 2021-06-27 DIAGNOSIS — R7309 Other abnormal glucose: Secondary | ICD-10-CM | POA: Diagnosis not present

## 2021-06-27 DIAGNOSIS — I10 Essential (primary) hypertension: Secondary | ICD-10-CM | POA: Diagnosis not present

## 2021-08-01 DIAGNOSIS — I10 Essential (primary) hypertension: Secondary | ICD-10-CM | POA: Diagnosis not present

## 2021-08-01 DIAGNOSIS — E7800 Pure hypercholesterolemia, unspecified: Secondary | ICD-10-CM | POA: Diagnosis not present

## 2021-08-01 DIAGNOSIS — Z6832 Body mass index (BMI) 32.0-32.9, adult: Secondary | ICD-10-CM | POA: Diagnosis not present

## 2021-08-01 DIAGNOSIS — N183 Chronic kidney disease, stage 3 unspecified: Secondary | ICD-10-CM | POA: Diagnosis not present

## 2021-10-03 DIAGNOSIS — N183 Chronic kidney disease, stage 3 unspecified: Secondary | ICD-10-CM | POA: Diagnosis not present

## 2021-10-03 DIAGNOSIS — E7800 Pure hypercholesterolemia, unspecified: Secondary | ICD-10-CM | POA: Diagnosis not present

## 2021-10-03 DIAGNOSIS — I10 Essential (primary) hypertension: Secondary | ICD-10-CM | POA: Diagnosis not present

## 2021-10-03 DIAGNOSIS — E669 Obesity, unspecified: Secondary | ICD-10-CM | POA: Diagnosis not present

## 2023-06-15 IMAGING — DX DG CHEST 2V
2 series · 2 of 2 positions shown · non-contrast
Comparison: August 31, 2016.

CLINICAL DATA: Chest pain.

EXAM:
CHEST - 2 VIEW

[chest pa]
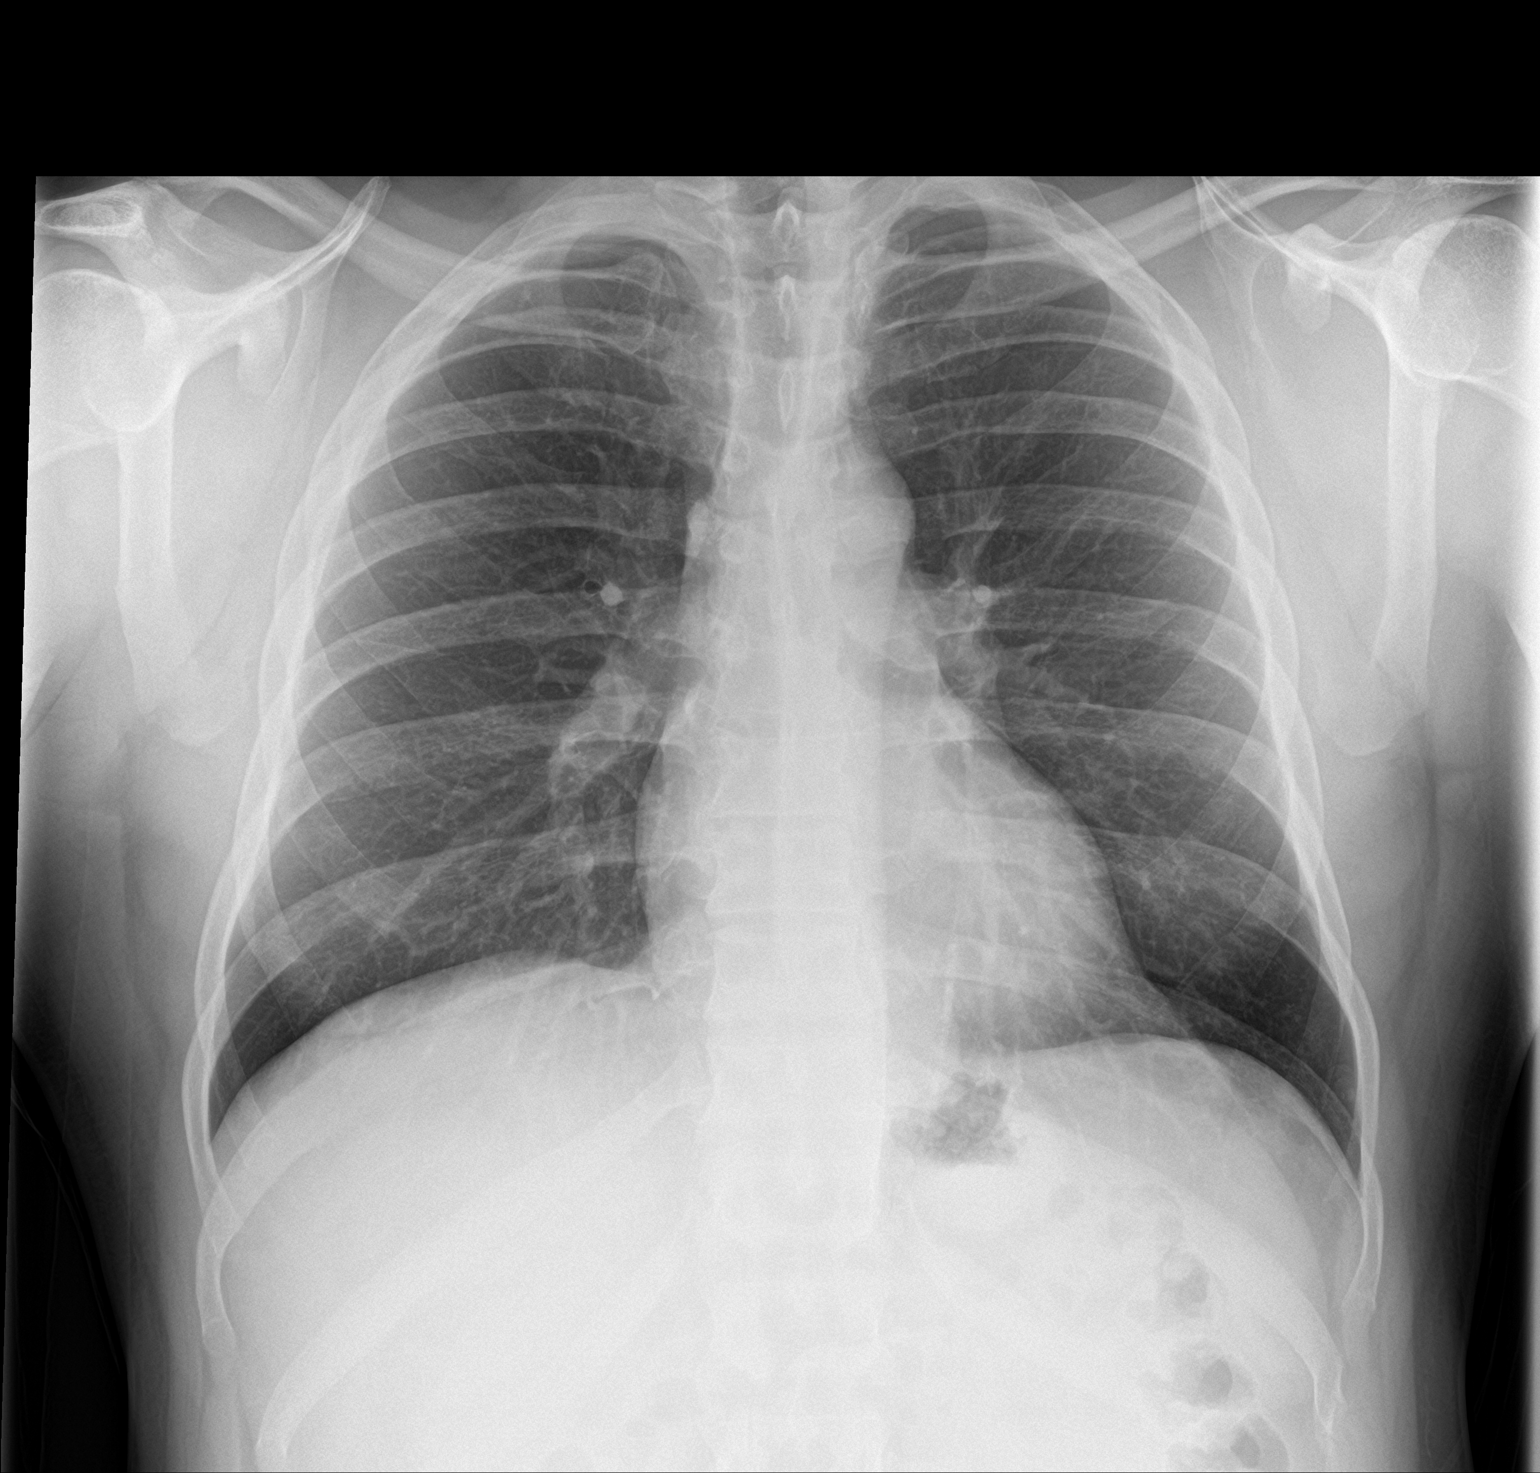

[chest lat]
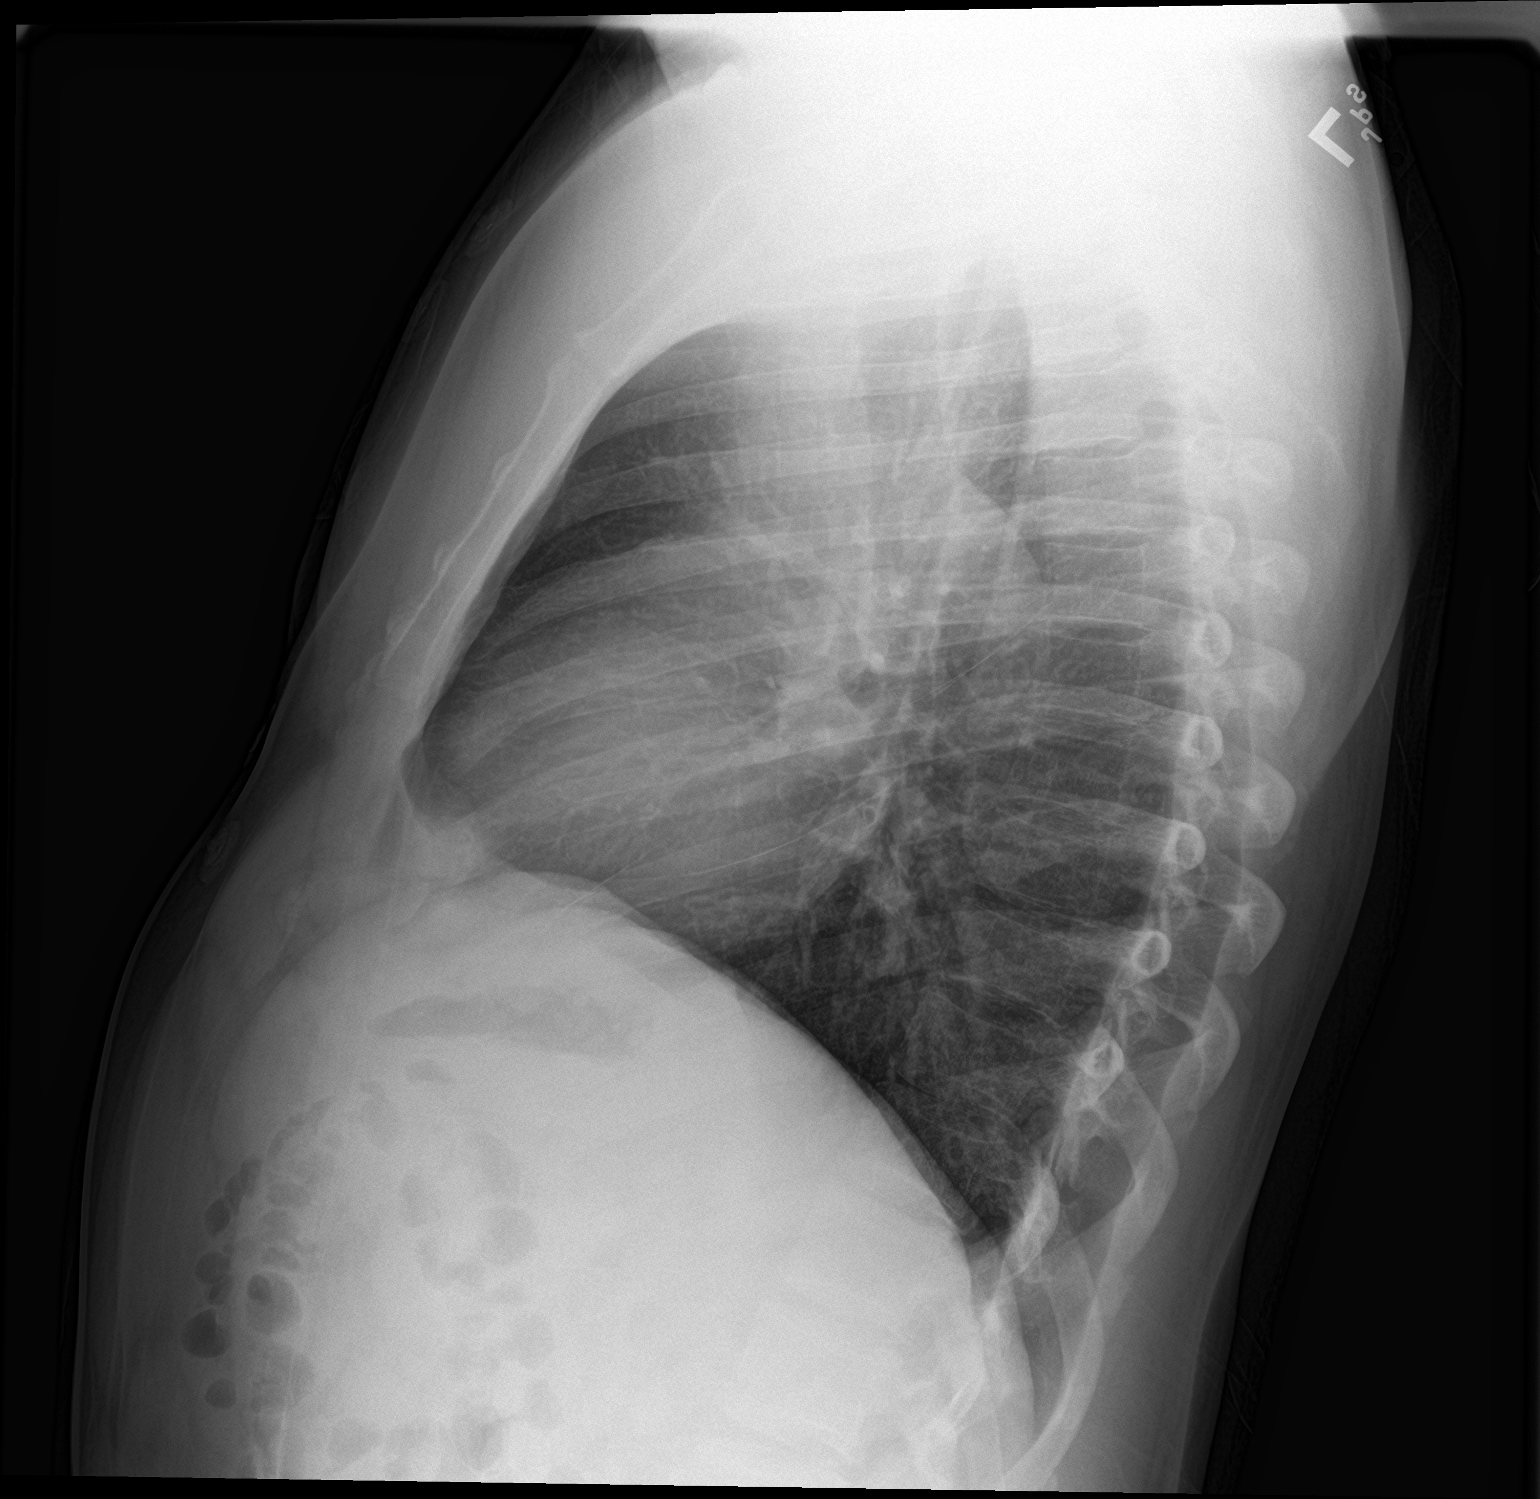

[2 of 2 positions shown; findings below may reference images not displayed]

FINDINGS: The heart size and mediastinal contours are within normal limits.
Both lungs are clear. The visualized skeletal structures are
unremarkable.
IMPRESSION: No active cardiopulmonary disease.
